# Patient Record
Sex: Male | Born: 1965 | Hispanic: No | Marital: Single | Smoking: Former smoker
Health system: Southern US, Community
[De-identification: ages and names within clinical notes are randomized; demographics above are authoritative.]

## PROBLEM LIST (undated history)

## (undated) DIAGNOSIS — T8859XA Other complications of anesthesia, initial encounter: Secondary | ICD-10-CM

## (undated) DIAGNOSIS — I10 Essential (primary) hypertension: Secondary | ICD-10-CM

## (undated) DIAGNOSIS — F419 Anxiety disorder, unspecified: Secondary | ICD-10-CM

## (undated) DIAGNOSIS — K219 Gastro-esophageal reflux disease without esophagitis: Secondary | ICD-10-CM

## (undated) DIAGNOSIS — Z9889 Other specified postprocedural states: Secondary | ICD-10-CM

## (undated) DIAGNOSIS — T4145XA Adverse effect of unspecified anesthetic, initial encounter: Secondary | ICD-10-CM

## (undated) DIAGNOSIS — G4730 Sleep apnea, unspecified: Secondary | ICD-10-CM

## (undated) DIAGNOSIS — R112 Nausea with vomiting, unspecified: Secondary | ICD-10-CM

## (undated) HISTORY — PX: BRONCHOSCOPIC REMOVAL OF FOREIGN BODY: SHX5171

## (undated) HISTORY — PX: TONSILLECTOMY: SUR1361

---
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
## 1989-02-13 DIAGNOSIS — R112 Nausea with vomiting, unspecified: Secondary | ICD-10-CM

## 1989-02-13 DIAGNOSIS — Z9889 Other specified postprocedural states: Secondary | ICD-10-CM

## 1989-02-13 HISTORY — DX: Other specified postprocedural states: Z98.890

## 1989-02-13 HISTORY — DX: Nausea with vomiting, unspecified: R11.2

## 1994-06-15 HISTORY — PX: EPIDIDYMECTOMY: SHX478

## 2012-01-01 ENCOUNTER — Emergency Department (HOSPITAL_BASED_OUTPATIENT_CLINIC_OR_DEPARTMENT_OTHER): Payer: Worker's Compensation

## 2012-01-01 ENCOUNTER — Emergency Department (HOSPITAL_BASED_OUTPATIENT_CLINIC_OR_DEPARTMENT_OTHER)
Admission: EM | Admit: 2012-01-01 | Discharge: 2012-01-01 | Disposition: A | Discharge status: Home / Self Care | Payer: Worker's Compensation | Attending: Emergency Medicine | Admitting: Emergency Medicine

## 2012-01-01 ENCOUNTER — Encounter (HOSPITAL_BASED_OUTPATIENT_CLINIC_OR_DEPARTMENT_OTHER): Payer: Self-pay | Admitting: Emergency Medicine

## 2012-01-01 DIAGNOSIS — F411 Generalized anxiety disorder: Secondary | ICD-10-CM | POA: Insufficient documentation

## 2012-01-01 DIAGNOSIS — M47817 Spondylosis without myelopathy or radiculopathy, lumbosacral region: Secondary | ICD-10-CM | POA: Insufficient documentation

## 2012-01-01 DIAGNOSIS — K219 Gastro-esophageal reflux disease without esophagitis: Secondary | ICD-10-CM | POA: Insufficient documentation

## 2012-01-01 DIAGNOSIS — T148XXA Other injury of unspecified body region, initial encounter: Secondary | ICD-10-CM

## 2012-01-01 DIAGNOSIS — S76219A Strain of adductor muscle, fascia and tendon of unspecified thigh, initial encounter: Secondary | ICD-10-CM

## 2012-01-01 DIAGNOSIS — W010XXA Fall on same level from slipping, tripping and stumbling without subsequent striking against object, initial encounter: Secondary | ICD-10-CM | POA: Insufficient documentation

## 2012-01-01 DIAGNOSIS — F17200 Nicotine dependence, unspecified, uncomplicated: Secondary | ICD-10-CM | POA: Insufficient documentation

## 2012-01-01 DIAGNOSIS — S72009A Fracture of unspecified part of neck of unspecified femur, initial encounter for closed fracture: Secondary | ICD-10-CM | POA: Insufficient documentation

## 2012-01-01 DIAGNOSIS — I10 Essential (primary) hypertension: Secondary | ICD-10-CM | POA: Insufficient documentation

## 2012-01-01 DIAGNOSIS — IMO0002 Reserved for concepts with insufficient information to code with codable children: Secondary | ICD-10-CM | POA: Insufficient documentation

## 2012-01-01 HISTORY — DX: Essential (primary) hypertension: I10

## 2012-01-01 HISTORY — DX: Anxiety disorder, unspecified: F41.9

## 2012-01-01 HISTORY — DX: Gastro-esophageal reflux disease without esophagitis: K21.9

## 2012-01-01 MED ORDER — HYDROCODONE-ACETAMINOPHEN 5-500 MG PO TABS: 1.0000 | ORAL_TABLET | Freq: Four times a day (QID) | ORAL | Status: AC | PRN

## 2012-01-01 MED ORDER — HYDROCODONE-ACETAMINOPHEN 5-325 MG PO TABS
2.0000 | ORAL_TABLET | Freq: Once | ORAL | Status: AC
  Administered 2012-01-01: 2 via ORAL
  Filled 2012-01-01: qty 2

## 2012-01-01 NOTE — ED Provider Notes (Signed)
History    46 year old male with right hip and right groin pain. Acute onset earlier this evening while at work. Patient states that he slipped on a wet surface. Pivoted ackwardly on foot and then fell to ground. HAs been having persistent pain since. Mild to moderate ache at rest and much more intense pain with movement. Can ambulate, but with increased pain. No numbness, tingling or loss of strength. No abdominall or back pain.   CSN: [id number]  Arrival date & time 01/01/12  0038   First MD Initiated Contact with Patient 01/01/12 0106      Chief Complaint  Patient presents with  . Fall    (Consider location/radiation/quality/duration/timing/severity/associated sxs/prior treatment) HPI  Past Medical History  Diagnosis Date  . Anxiety   . Hypertension   . GERD (gastroesophageal reflux disease)     History reviewed. No pertinent past surgical history.  History reviewed. No pertinent family history.  History  Substance Use Topics  . Smoking status: Current Everyday Smoker    Types: Cigarettes  . Smokeless tobacco: Not on file  . Alcohol Use: No      Review of Systems   Review of symptoms negative unless otherwise noted in HPI.   Allergies  Nsaids  Home Medications   Current Outpatient Rx  Name Route Sig Dispense Refill  . ALPRAZOLAM 1 MG PO TABS Oral Take 1 mg by mouth at bedtime as needed.    Marland Kitchen METOPROLOL TARTRATE 50 MG PO TABS Oral Take 50 mg by mouth 2 (two) times daily.    Marland Kitchen OMEPRAZOLE 20 MG PO CPDR Oral Take 20 mg by mouth daily.    Marland Kitchen HYDROCODONE-ACETAMINOPHEN 5-500 MG PO TABS Oral Take 1-2 tablets by mouth every 6 (six) hours as needed for pain. 15 tablet 0    BP 138/86  Pulse 69  Temp 98.9 F (37.2 C) (Oral)  Resp 16  SpO2 99%  Physical Exam  Nursing note and vitals reviewed. Constitutional: He appears well-developed and well-nourished. No distress.  HENT:  Head: Normocephalic and atraumatic.  Eyes: Conjunctivae are normal. Right eye exhibits  no discharge. Left eye exhibits no discharge.  Neck: Neck supple.  Cardiovascular: Normal rate, regular rhythm and normal heart sounds.  Exam reveals no gallop and no friction rub.   No murmur heard. Pulmonary/Chest: Effort normal and breath sounds normal. No respiratory distress.  Abdominal: Soft. He exhibits no distension. There is no tenderness.  Genitourinary:       Normal external male genitalia. No hernia appreciated.  Musculoskeletal: He exhibits no edema and no tenderness.       Tenderness along the right inguinal crease, particularly the medial aspect. No hernia appreciated. No overlying skin changes. Pain very reproducible with hip flexion and internal rotation. Neurovascularly intact distally.  Neurological: He is alert.  Skin: Skin is warm and dry.  Psychiatric: He has a normal mood and affect. His behavior is normal. Thought content normal.    ED Course  Procedures (including critical care time)  Labs Reviewed - No data to display Dg Hip Complete Right  01/01/2012  *RADIOLOGY REPORT*  Clinical Data: Right hip pain after fall yesterday.  Right groin pain.  RIGHT HIP - COMPLETE 2+ VIEW  Comparison: None.  Findings: There is a focal calcification along the medial aspect of the right femoral neck which appears to be well corticated.  This could represent ligamentous calcification but an avulsion fragment is not excluded.  Mild degenerative changes in the lower lumbar spine and hips.  Pelvis, sacrum, SI joints, and symphysis pubis appear intact.  No additional evidence of acute fracture or subluxation of the right hip.  No focal bone lesion or bone destruction.  IMPRESSION: No displaced fractures identified.  Nonspecific osseous fragment medial to the right femoral neck could represent ligamentous calcification or avulsion fragment.  Original Report Authenticated By: Marlon Pel, M.D.     1. Inguinal strain   2. Femoral avulsion fracture    MDM  46ym with L hip/groin  pain. XR with possible avulsion fx which may be the case given pt's symptoms and exam.. Plan symptomatic tx at this time and ortho referral.        Raeford Razor, MD 01/01/12 [phone]

## 2012-01-01 NOTE — ED Notes (Signed)
Slipped on something wet on floor at work and right leg went out from under him, developed immediate pain in right hip and right groin

## 2012-07-16 ENCOUNTER — Inpatient Hospital Stay (HOSPITAL_COMMUNITY)
Admission: EM | Admit: 2012-07-16 | Discharge: 2012-07-17 | DRG: 175 | Disposition: A | Discharge status: Home / Self Care | Payer: BC Managed Care – PPO | Attending: Emergency Medicine | Admitting: Emergency Medicine

## 2012-07-16 ENCOUNTER — Encounter (HOSPITAL_COMMUNITY): Payer: Self-pay | Admitting: *Deleted

## 2012-07-16 DIAGNOSIS — Z79899 Other long term (current) drug therapy: Secondary | ICD-10-CM

## 2012-07-16 DIAGNOSIS — K219 Gastro-esophageal reflux disease without esophagitis: Secondary | ICD-10-CM | POA: Diagnosis present

## 2012-07-16 DIAGNOSIS — F411 Generalized anxiety disorder: Secondary | ICD-10-CM | POA: Diagnosis present

## 2012-07-16 DIAGNOSIS — F17200 Nicotine dependence, unspecified, uncomplicated: Secondary | ICD-10-CM | POA: Diagnosis present

## 2012-07-16 DIAGNOSIS — I10 Essential (primary) hypertension: Secondary | ICD-10-CM | POA: Diagnosis present

## 2012-07-16 DIAGNOSIS — K625 Hemorrhage of anus and rectum: Secondary | ICD-10-CM | POA: Diagnosis present

## 2012-07-16 DIAGNOSIS — K922 Gastrointestinal hemorrhage, unspecified: Principal | ICD-10-CM | POA: Diagnosis present

## 2012-07-16 LAB — CBC WITH DIFFERENTIAL/PLATELET
Eosinophils Absolute: 0.1 10*3/uL (ref 0.0–0.7)
HCT: 49.3 % (ref 39.0–52.0)
Hemoglobin: 17.5 g/dL — ABNORMAL HIGH (ref 13.0–17.0)
Lymphs Abs: 2.1 10*3/uL (ref 0.7–4.0)
MCH: 30.2 pg (ref 26.0–34.0)
MCHC: 35.5 g/dL (ref 30.0–36.0)
MCV: 85.1 fL (ref 78.0–100.0)
Monocytes Absolute: 0.5 10*3/uL (ref 0.1–1.0)
Monocytes Relative: 6 % (ref 3–12)
Neutrophils Relative %: 67 % (ref 43–77)
RBC: 5.79 MIL/uL (ref 4.22–5.81)

## 2012-07-16 LAB — HEPATIC FUNCTION PANEL
Albumin: 4.5 g/dL (ref 3.5–5.2)
Alkaline Phosphatase: 104 U/L (ref 39–117)
Bilirubin, Direct: <0.1 mg/dL (ref 0.0–0.3)
Total Bilirubin: 0.4 mg/dL (ref 0.3–1.2)

## 2012-07-16 LAB — PROTIME-INR: Prothrombin Time: 12.6 seconds (ref 11.6–15.2)

## 2012-07-16 LAB — POCT I-STAT, CHEM 8
BUN: 17 mg/dL (ref 6–23)
Chloride: 106 mEq/L (ref 96–112)
Glucose, Bld: 156 mg/dL — ABNORMAL HIGH (ref 70–99)
HCT: 53 % — ABNORMAL HIGH (ref 39.0–52.0)
Potassium: 3.6 mEq/L (ref 3.5–5.1)

## 2012-07-16 LAB — LACTIC ACID, PLASMA: Lactic Acid, Venous: 3.1 mmol/L — ABNORMAL HIGH (ref 0.5–2.2)

## 2012-07-16 MED ORDER — HYDROMORPHONE HCL PF 1 MG/ML IJ SOLN
1.0000 mg | Freq: Once | INTRAMUSCULAR | Status: AC
Start: 2012-07-16 — End: 2012-07-16
  Administered 2012-07-16: 1 mg via INTRAVENOUS
  Filled 2012-07-16: qty 1

## 2012-07-16 MED ORDER — SODIUM CHLORIDE 0.9 % IV BOLUS (SEPSIS)
1000.0000 mL | Freq: Once | INTRAVENOUS | Status: AC
  Administered 2012-07-16: 1000 mL via INTRAVENOUS

## 2012-07-16 MED ORDER — HYDROMORPHONE HCL PF 1 MG/ML IJ SOLN
1.0000 mg | Freq: Once | INTRAMUSCULAR | Status: AC
  Administered 2012-07-16: 1 mg via INTRAVENOUS
  Filled 2012-07-16: qty 1

## 2012-07-16 MED ORDER — ONDANSETRON HCL 4 MG/2ML IJ SOLN
4.0000 mg | Freq: Once | INTRAMUSCULAR | Status: AC
  Administered 2012-07-16: 4 mg via INTRAVENOUS
  Filled 2012-07-16: qty 2

## 2012-07-16 NOTE — ED Provider Notes (Signed)
History     CSN: [id number]  Arrival date & time 07/16/12  2223   None     Chief Complaint  Patient presents with  . Abdominal Pain    (Consider location/radiation/quality/duration/timing/severity/associated sxs/prior treatment) HPI Comments: Sudden onset diffuse abdominal pan with bloody BM about 3 hours ago.  Reports yesterday notice some blood in stool X1.  Has Hx of blood in stool has colonoscopy 2008 which revealed polyps.  Has Hx GERD. Denies NSAIS use   Patient is a 47 y.o. male presenting with abdominal pain.  Abdominal Pain The primary symptoms of the illness include abdominal pain and nausea. The primary symptoms of the illness do not include fever, shortness of breath, vomiting or diarrhea. The current episode started 1 to 2 hours ago. The onset of the illness was sudden. The problem has been rapidly worsening.  The abdominal pain began 3 to 5 hours ago. The pain came on suddenly. The abdominal pain has been gradually worsening since its onset. The abdominal pain is generalized. The severity of the abdominal pain is 9/10. The abdominal pain is relieved by nothing.  The patient states that she believes she is currently not pregnant. The patient has had a change in bowel habit. Symptoms associated with the illness do not include chills.    Past Medical History  Diagnosis Date  . Anxiety   . Hypertension   . GERD (gastroesophageal reflux disease)     History reviewed. No pertinent past surgical history.  Family History  Problem Relation Age of Onset  . COPD Mother   . Heart failure Father     History  Substance Use Topics  . Smoking status: Current Every Day Smoker    Types: Cigarettes  . Smokeless tobacco: Not on file  . Alcohol Use: No      Review of Systems  Constitutional: Negative for fever and chills.  HENT: Negative.   Eyes: Negative.   Respiratory: Negative for shortness of breath.   Cardiovascular: Negative for chest pain.  Gastrointestinal:  Positive for nausea, abdominal pain and blood in stool. Negative for vomiting, diarrhea and rectal pain.  Genitourinary: Negative for decreased urine volume.  Skin: Negative for rash and wound.  Neurological: Negative for weakness.    Allergies  Nsaids  Home Medications   Current Outpatient Rx  Name  Route  Sig  Dispense  Refill  . ALPRAZOLAM 1 MG PO TABS   Oral   Take 1 mg by mouth 3 (three) times daily as needed. For anxiety         . METOPROLOL TARTRATE 50 MG PO TABS   Oral   Take 50 mg by mouth daily.          Marland Kitchen OMEPRAZOLE 20 MG PO CPDR   Oral   Take 20 mg by mouth daily.           BP 120/68  Pulse 80  Resp 18  SpO2 95%  Physical Exam  Constitutional: He appears well-developed and well-nourished.  HENT:  Head: Normocephalic.  Eyes: Pupils are equal, round, and reactive to light.  Neck: Normal range of motion.  Cardiovascular: Normal rate.   Pulmonary/Chest: Effort normal.  Genitourinary:       Bright red blood in toilet with BM little stool   Musculoskeletal: Normal range of motion.  Neurological: He is alert.  Skin: Skin is warm and dry. No pallor.    ED Course  Procedures (including critical care time)  Labs Reviewed  CBC WITH  DIFFERENTIAL - Abnormal; Notable for the following:    Hemoglobin 17.5 (*)     All other components within normal limits  LACTIC ACID, PLASMA - Abnormal; Notable for the following:    Lactic Acid, Venous 3.1 (*)     All other components within normal limits  POCT I-STAT, CHEM 8 - Abnormal; Notable for the following:    Creatinine, Ser 1.40 (*)     Glucose, Bld 156 (*)     Calcium, Ion 1.07 (*)     Hemoglobin 18.0 (*)     HCT 53.0 (*)     All other components within normal limits  HEPATIC FUNCTION PANEL  PROTIME-INR  TYPE AND SCREEN  ABO/RH  LACTIC ACID, PLASMA  CBC   Ct Abdomen Pelvis W Contrast  07/17/2012  *RADIOLOGY REPORT*  Clinical Data: Right-sided abdominal pain, nausea and vomiting.  GI bleed.  CT  ABDOMEN AND PELVIS WITH CONTRAST  Technique:  Multidetector CT imaging of the abdomen and pelvis was performed following the standard protocol during bolus administration of intravenous contrast.  Contrast: OMNIPAQUE IOHEXOL 300 MG/ML  SOLN  Comparison: None.  Findings: Mild dependent atelectasis in the lung bases.  Thickening of the wall of the distal esophagus suggesting esophagitis.  Low attenuation change throughout the liver consistent with fatty infiltration.  Sub centimeter low attenuation lesions centrally in the liver likely representing a small cyst or hemangioma.  The gallbladder, spleen, pancreas, adrenal glands, kidneys, abdominal aorta, and retroperitoneal lymph nodes are unremarkable.  Celiac axis lymph nodes are noted but are not pathologically enlarged. The stomach, small bowel, and colon are not abnormally distended. No discrete wall thickening is appreciated.  No free air or free fluid in the abdomen.  Flow is demonstrated in the portal and mesenteric vessels.  Pelvis:  Prostate gland is not enlarged but contains calcifications.  The bladder wall is not thickened.  No evidence of diverticulitis.  The appendix is not identified.  Probable opaque medication in the cecum.  No free or loculated pelvic fluid collections.  No significant pelvic lymphadenopathy.  Degenerative changes in the lumbar spine.  IMPRESSION: Diffuse fatty infiltration of the liver.  Thickening of the wall of the distal esophagus may suggest esophagitis.  No acute process otherwise identified.   Original Report Authenticated By: Burman Nieves, M.D.      1. GI bleed       MDM  Ct Scan negative VS stable labs reviewed  Pain controlled with Morphine to be admitted and observed  Spoke with patient and family  Cbc improved as well as Lactic acid not normal will return if needed and will establish with PCP      Arman Filter, NP 07/17/12 0411  Arman Filter, NP 07/17/12 0411  Arman Filter, NP 07/17/12  [phone]

## 2012-07-16 NOTE — ED Notes (Signed)
Pt in c/o severe RLQ abd pain over last few hours, pain came on suddenly, increased over last hour and radiating into right flank and back, also n/v, pt unable to sit still due to pain

## 2012-07-16 NOTE — ED Provider Notes (Signed)
47 year old who with a history of lower GI bleed in the past presents with acute onset of lower abdominal pain associated with nausea and voluminous amount of lower GI bleeding.  In the past his prior GI bleed was related to a polyp that was cauterized on colonoscopy  On exam the patient has a soft abdomen which is nontender on the left, minimally tender in the right lower quadrant, non-peritoneal. His heart and lung exam is normal, the patient appears uncomfortable and has dry mucous membranes.  We'll obtain labs to evaluate for lower GI bleed, will need rectal exam performed by nurse practitioner, pain medication, reevaluate  Medical screening examination/treatment/procedure(s) were conducted as a shared visit with non-physician practitioner(s) and myself.  I personally evaluated the patient during the encounter    Vida Roller, MD 07/16/12 2320

## 2012-07-17 ENCOUNTER — Emergency Department (HOSPITAL_COMMUNITY): Payer: BC Managed Care – PPO

## 2012-07-17 ENCOUNTER — Encounter (HOSPITAL_COMMUNITY): Payer: Self-pay | Admitting: Internal Medicine

## 2012-07-17 DIAGNOSIS — K625 Hemorrhage of anus and rectum: Secondary | ICD-10-CM | POA: Diagnosis present

## 2012-07-17 LAB — ABO/RH: ABO/RH(D): B POS

## 2012-07-17 LAB — CBC
HCT: 46.6 % (ref 39.0–52.0)
Hemoglobin: 16.2 g/dL (ref 13.0–17.0)
MCHC: 34.8 g/dL (ref 30.0–36.0)
RBC: 5.42 MIL/uL (ref 4.22–5.81)

## 2012-07-17 LAB — TYPE AND SCREEN

## 2012-07-17 LAB — LACTIC ACID, PLASMA: Lactic Acid, Venous: 1.4 mmol/L (ref 0.5–2.2)

## 2012-07-17 MED ORDER — MORPHINE SULFATE 4 MG/ML IJ SOLN
4.0000 mg | Freq: Once | INTRAMUSCULAR | Status: AC
  Administered 2012-07-17: 4 mg via INTRAVENOUS
  Filled 2012-07-17: qty 1

## 2012-07-17 MED ORDER — IOHEXOL 300 MG/ML  SOLN
100.0000 mL | Freq: Once | INTRAMUSCULAR | Status: AC | PRN
  Administered 2012-07-17: 100 mL via INTRAVENOUS

## 2012-07-17 MED ORDER — IOHEXOL 300 MG/ML  SOLN
25.0000 mL | INTRAMUSCULAR | Status: AC
  Administered 2012-07-17 (×2): 25 mL via ORAL

## 2012-07-17 MED ORDER — KETOROLAC TROMETHAMINE 30 MG/ML IJ SOLN
30.0000 mg | Freq: Once | INTRAMUSCULAR | Status: AC
  Administered 2012-07-17: 30 mg via INTRAVENOUS
  Filled 2012-07-17: qty 1

## 2012-07-17 NOTE — ED Notes (Signed)
Patient transported to CT 

## 2012-07-17 NOTE — ED Notes (Signed)
Pt finished contrast for CT scan

## 2012-07-17 NOTE — ED Notes (Signed)
Patient returned from CT

## 2012-07-17 NOTE — ED Notes (Signed)
Pt refuses to be admitted,  So we will do a repeat blood draw per Dondra Spry NP

## 2012-07-18 ENCOUNTER — Encounter (HOSPITAL_BASED_OUTPATIENT_CLINIC_OR_DEPARTMENT_OTHER): Payer: Self-pay | Admitting: *Deleted

## 2012-07-18 ENCOUNTER — Emergency Department (HOSPITAL_BASED_OUTPATIENT_CLINIC_OR_DEPARTMENT_OTHER)
Admission: EM | Admit: 2012-07-18 | Discharge: 2012-07-19 | Disposition: A | Discharge status: Home / Self Care | Payer: BC Managed Care – PPO | Attending: Emergency Medicine | Admitting: Emergency Medicine

## 2012-07-18 DIAGNOSIS — I10 Essential (primary) hypertension: Secondary | ICD-10-CM | POA: Insufficient documentation

## 2012-07-18 DIAGNOSIS — K219 Gastro-esophageal reflux disease without esophagitis: Secondary | ICD-10-CM | POA: Insufficient documentation

## 2012-07-18 DIAGNOSIS — N200 Calculus of kidney: Secondary | ICD-10-CM | POA: Insufficient documentation

## 2012-07-18 DIAGNOSIS — Z79899 Other long term (current) drug therapy: Secondary | ICD-10-CM | POA: Insufficient documentation

## 2012-07-18 DIAGNOSIS — F17200 Nicotine dependence, unspecified, uncomplicated: Secondary | ICD-10-CM | POA: Insufficient documentation

## 2012-07-18 DIAGNOSIS — F411 Generalized anxiety disorder: Secondary | ICD-10-CM | POA: Insufficient documentation

## 2012-07-18 LAB — COMPREHENSIVE METABOLIC PANEL
ALT: 23 U/L (ref 0–53)
AST: 14 U/L (ref 0–37)
Alkaline Phosphatase: 84 U/L (ref 39–117)
CO2: 23 mEq/L (ref 19–32)
Calcium: 8.8 mg/dL (ref 8.4–10.5)
GFR calc Af Amer: 81 mL/min — ABNORMAL LOW (ref >90)
GFR calc non Af Amer: 70 mL/min — ABNORMAL LOW (ref >90)
Glucose, Bld: 116 mg/dL — ABNORMAL HIGH (ref 70–99)
Potassium: 3.5 mEq/L (ref 3.5–5.1)
Sodium: 139 mEq/L (ref 135–145)
Total Protein: 6.4 g/dL (ref 6.0–8.3)

## 2012-07-18 LAB — URINE MICROSCOPIC-ADD ON

## 2012-07-18 LAB — CBC WITH DIFFERENTIAL/PLATELET
Basophils Absolute: 0 10*3/uL (ref 0.0–0.1)
Eosinophils Relative: 2 % (ref 0–5)
Lymphocytes Relative: 17 % (ref 12–46)
Lymphs Abs: 1.7 10*3/uL (ref 0.7–4.0)
Neutrophils Relative %: 74 % (ref 43–77)
Platelets: 151 10*3/uL (ref 150–400)
RBC: 5.31 MIL/uL (ref 4.22–5.81)
RDW: 13.3 % (ref 11.5–15.5)
WBC: 10.3 10*3/uL (ref 4.0–10.5)

## 2012-07-18 LAB — URINALYSIS, ROUTINE W REFLEX MICROSCOPIC
Glucose, UA: NEGATIVE mg/dL
Leukocytes, UA: NEGATIVE
Protein, ur: NEGATIVE mg/dL
Specific Gravity, Urine: 1.023 (ref 1.005–1.030)
pH: 5.5 (ref 5.0–8.0)

## 2012-07-18 MED ORDER — HYDROMORPHONE HCL PF 1 MG/ML IJ SOLN
1.0000 mg | Freq: Once | INTRAMUSCULAR | Status: AC
  Administered 2012-07-18: 1 mg via INTRAVENOUS
  Filled 2012-07-18: qty 1

## 2012-07-18 MED ORDER — TAMSULOSIN HCL 0.4 MG PO CAPS: 0.4000 mg | ORAL_CAPSULE | Freq: Every day | ORAL | Status: DC

## 2012-07-18 MED ORDER — ONDANSETRON HCL 4 MG/2ML IJ SOLN
4.0000 mg | Freq: Once | INTRAMUSCULAR | Status: AC
  Administered 2012-07-18: 4 mg via INTRAVENOUS
  Filled 2012-07-18: qty 2

## 2012-07-18 MED ORDER — SODIUM CHLORIDE 0.9 % IV SOLN
Freq: Once | INTRAVENOUS | Status: AC
  Administered 2012-07-18: 23:00:00 via INTRAVENOUS

## 2012-07-18 MED ORDER — OXYCODONE-ACETAMINOPHEN 5-325 MG PO TABS: 2.0000 | ORAL_TABLET | ORAL | Status: DC | PRN

## 2012-07-18 MED ORDER — KETOROLAC TROMETHAMINE 30 MG/ML IJ SOLN
30.0000 mg | Freq: Once | INTRAMUSCULAR | Status: AC
  Administered 2012-07-18: 30 mg via INTRAVENOUS
  Filled 2012-07-18: qty 1

## 2012-07-18 NOTE — ED Provider Notes (Signed)
History     CSN: [id number]  Arrival date & time 07/18/12  2101   First MD Initiated Contact with Patient 07/18/12 2146      Chief Complaint  Patient presents with  . Flank Pain    (Consider location/radiation/quality/duration/timing/severity/associated sxs/prior treatment) Patient is a 47 y.o. male presenting with flank pain. The history is provided by the patient. No language interpreter was used.  Flank Pain This is a new problem. The current episode started in the past 7 days. The problem occurs constantly. The problem has been gradually worsening. Associated symptoms include abdominal pain. Nothing aggravates the symptoms. He has tried nothing for the symptoms.  Pt reports he was seen  Past Medical History  Diagnosis Date  . Anxiety   . Hypertension   . GERD (gastroesophageal reflux disease)     History reviewed. No pertinent past surgical history.  Family History  Problem Relation Age of Onset  . COPD Mother   . Heart failure Father     History  Substance Use Topics  . Smoking status: Current Every Day Smoker    Types: Cigarettes  . Smokeless tobacco: Not on file  . Alcohol Use: No      Review of Systems  Gastrointestinal: Positive for abdominal pain.  Genitourinary: Positive for flank pain.  All other systems reviewed and are negative.    Allergies  Nsaids  Home Medications   Current Outpatient Rx  Name  Route  Sig  Dispense  Refill  . ALPRAZOLAM 1 MG PO TABS   Oral   Take 1 mg by mouth 3 (three) times daily as needed. For anxiety         . METOPROLOL TARTRATE 50 MG PO TABS   Oral   Take 50 mg by mouth daily.          Marland Kitchen OMEPRAZOLE 20 MG PO CPDR   Oral   Take 20 mg by mouth daily.           BP 134/85  Pulse 76  Temp 99.1 F (37.3 C) (Oral)  Resp 14  SpO2 98%  Physical Exam  Nursing note and vitals reviewed. Constitutional: He is oriented to person, place, and time.  HENT:  Head: Normocephalic.  Right Ear: External ear  normal.  Left Ear: External ear normal.  Nose: Nose normal.  Mouth/Throat: Oropharynx is clear and moist.  Eyes: Conjunctivae normal and EOM are normal. Pupils are equal, round, and reactive to light.  Neck: Normal range of motion.  Cardiovascular: Normal rate and normal heart sounds.   Pulmonary/Chest: Effort normal and breath sounds normal.  Abdominal: Soft. Bowel sounds are normal.  Musculoskeletal: Normal range of motion.  Neurological: He is alert and oriented to person, place, and time. He has normal reflexes.  Skin: Skin is warm.  Psychiatric: He has a normal mood and affect.    ED Course  Procedures (including critical care time)  Labs Reviewed  URINALYSIS, ROUTINE W REFLEX MICROSCOPIC - Abnormal; Notable for the following:    Color, Urine AMBER (*)  BIOCHEMICALS MAY BE AFFECTED BY COLOR   Hgb urine dipstick LARGE (*)     Bilirubin Urine SMALL (*)     Ketones, ur 15 (*)     All other components within normal limits  URINE MICROSCOPIC-ADD ON  CBC WITH DIFFERENTIAL  COMPREHENSIVE METABOLIC PANEL   Ct Abdomen Pelvis W Contrast  07/17/2012  *RADIOLOGY REPORT*  Clinical Data: Right-sided abdominal pain, nausea and vomiting.  GI bleed.  CT  ABDOMEN AND PELVIS WITH CONTRAST  Technique:  Multidetector CT imaging of the abdomen and pelvis was performed following the standard protocol during bolus administration of intravenous contrast.  Contrast: OMNIPAQUE IOHEXOL 300 MG/ML  SOLN  Comparison: None.  Findings: Mild dependent atelectasis in the lung bases.  Thickening of the wall of the distal esophagus suggesting esophagitis.  Low attenuation change throughout the liver consistent with fatty infiltration.  Sub centimeter low attenuation lesions centrally in the liver likely representing a small cyst or hemangioma.  The gallbladder, spleen, pancreas, adrenal glands, kidneys, abdominal aorta, and retroperitoneal lymph nodes are unremarkable.  Celiac axis lymph nodes are noted but are  not pathologically enlarged. The stomach, small bowel, and colon are not abnormally distended. No discrete wall thickening is appreciated.  No free air or free fluid in the abdomen.  Flow is demonstrated in the portal and mesenteric vessels.  Pelvis:  Prostate gland is not enlarged but contains calcifications.  The bladder wall is not thickened.  No evidence of diverticulitis.  The appendix is not identified.  Probable opaque medication in the cecum.  No free or loculated pelvic fluid collections.  No significant pelvic lymphadenopathy.  Degenerative changes in the lumbar spine.  IMPRESSION: Diffuse fatty infiltration of the liver.  Thickening of the wall of the distal esophagus may suggest esophagitis.  No acute process otherwise identified.   Original Report Authenticated By: Burman Nieves, M.D.      1. Kidney stone       MDM    Ct scan reviewed by Dr. Ranae Palms,   Pt has an area inright lower ureter that looks like a small stone.   I spoke to the radiologist who reviewed ct from 2 days ago and sees a 2mm stone at right uvj.   Pt advised of results.   Pt reports some relief with dilaudid.  Pt given Toradol and dilaudid IV.     Pt advised to follow up with urology.       Lonia Skinner [place], [place] 07/18/12 [phone]

## 2012-07-18 NOTE — ED Notes (Addendum)
Right flank pain x 2 days. Was seen at [facility] and had a negative work up. Drove himself here.

## 2012-07-18 NOTE — ED Provider Notes (Signed)
  Medical screening examination/treatment/procedure(s) were conducted as a shared visit with non-physician practitioner(s) and myself.  I personally evaluated the patient during the encounter  Please see my separate respective documentation pertaining to this patient encounter   Vida Roller, MD 07/18/12 [phone]

## 2012-07-18 NOTE — ED Notes (Signed)
Seen at [facility] on 07/16/12 for same.  CT negative.

## 2012-07-19 NOTE — ED Provider Notes (Signed)
Medical screening examination/treatment/procedure(s) were performed by non-physician practitioner and as supervising physician I was immediately available for consultation/collaboration.   Loren Racer, MD 07/19/12 2222

## 2012-07-20 ENCOUNTER — Encounter (HOSPITAL_BASED_OUTPATIENT_CLINIC_OR_DEPARTMENT_OTHER): Payer: Self-pay | Admitting: Emergency Medicine

## 2012-07-20 ENCOUNTER — Emergency Department (HOSPITAL_BASED_OUTPATIENT_CLINIC_OR_DEPARTMENT_OTHER)
Admission: EM | Admit: 2012-07-20 | Discharge: 2012-07-20 | Disposition: A | Discharge status: Home / Self Care | Payer: BC Managed Care – PPO | Attending: Emergency Medicine | Admitting: Emergency Medicine

## 2012-07-20 DIAGNOSIS — R110 Nausea: Secondary | ICD-10-CM | POA: Insufficient documentation

## 2012-07-20 DIAGNOSIS — R509 Fever, unspecified: Secondary | ICD-10-CM | POA: Insufficient documentation

## 2012-07-20 DIAGNOSIS — F411 Generalized anxiety disorder: Secondary | ICD-10-CM | POA: Insufficient documentation

## 2012-07-20 DIAGNOSIS — N390 Urinary tract infection, site not specified: Secondary | ICD-10-CM

## 2012-07-20 DIAGNOSIS — K921 Melena: Secondary | ICD-10-CM | POA: Insufficient documentation

## 2012-07-20 DIAGNOSIS — K219 Gastro-esophageal reflux disease without esophagitis: Secondary | ICD-10-CM | POA: Insufficient documentation

## 2012-07-20 DIAGNOSIS — F17200 Nicotine dependence, unspecified, uncomplicated: Secondary | ICD-10-CM | POA: Insufficient documentation

## 2012-07-20 DIAGNOSIS — N23 Unspecified renal colic: Secondary | ICD-10-CM | POA: Insufficient documentation

## 2012-07-20 DIAGNOSIS — I10 Essential (primary) hypertension: Secondary | ICD-10-CM | POA: Insufficient documentation

## 2012-07-20 DIAGNOSIS — Z79899 Other long term (current) drug therapy: Secondary | ICD-10-CM | POA: Insufficient documentation

## 2012-07-20 LAB — URINE MICROSCOPIC-ADD ON

## 2012-07-20 LAB — URINALYSIS, ROUTINE W REFLEX MICROSCOPIC
Bilirubin Urine: NEGATIVE
Ketones, ur: NEGATIVE mg/dL
Specific Gravity, Urine: 1.018 (ref 1.005–1.030)
pH: 6 (ref 5.0–8.0)

## 2012-07-20 MED ORDER — HYDROMORPHONE HCL PF 1 MG/ML IJ SOLN
1.0000 mg | Freq: Once | INTRAMUSCULAR | Status: AC
  Administered 2012-07-20: 1 mg via INTRAVENOUS
  Filled 2012-07-20: qty 1

## 2012-07-20 MED ORDER — ONDANSETRON HCL 4 MG/2ML IJ SOLN
4.0000 mg | Freq: Once | INTRAMUSCULAR | Status: AC
  Administered 2012-07-20: 4 mg via INTRAVENOUS
  Filled 2012-07-20: qty 2

## 2012-07-20 MED ORDER — SODIUM CHLORIDE 0.9 % IV SOLN
INTRAVENOUS | Status: DC
  Administered 2012-07-20: 07:00:00 via INTRAVENOUS

## 2012-07-20 MED ORDER — HYDROMORPHONE HCL 4 MG PO TABS: 2.0000 mg | ORAL_TABLET | Freq: Four times a day (QID) | ORAL | Status: DC | PRN

## 2012-07-20 MED ORDER — CIPROFLOXACIN HCL 500 MG PO TABS: 500.0000 mg | ORAL_TABLET | Freq: Two times a day (BID) | ORAL | Status: DC

## 2012-07-20 MED ORDER — DEXTROSE 5 % IV SOLN
1.0000 g | Freq: Once | INTRAVENOUS | Status: AC
  Administered 2012-07-20: 1 g via INTRAVENOUS
  Filled 2012-07-20: qty 10

## 2012-07-20 NOTE — ED Notes (Signed)
Pt is calling his wife to come pick him up. Pt informed that he cannot drive due to narcotic administration. Pt verbalizes understanding and is attempting to call his wife, Boneta Lucks.

## 2012-07-20 NOTE — ED Provider Notes (Signed)
History     CSN: [id number]  Arrival date & time 07/20/12  0640   First MD Initiated Contact with Patient 07/20/12 [phone]      Chief Complaint  Patient presents with  . Flank Pain    (Consider location/radiation/quality/duration/timing/severity/associated sxs/prior treatment) HPI Pt presents to the ED complaining of severe aching R flank and RLQ abdominal pain. He was seen at the [facility] ED about 5 days ago for same when he was also having bloody stools. He had labs and CT done then which were unremarkable. He was feeling better and discharged. He returned here 2 days later with continued pain and re-evaluation of the prior CT revealed a R UVJ stone. He was given pain meds, flomax and was doing better. His pain is worse at night, has been well controlled with Percocet at home until early this morning when pain returned and has been severe and persistent, associated with nausea and low grade fever at home. No dysuria. He has also been constipated.  Past Medical History  Diagnosis Date  . Anxiety   . Hypertension   . GERD (gastroesophageal reflux disease)     History reviewed. No pertinent past surgical history.  Family History  Problem Relation Age of Onset  . COPD Mother   . Heart failure Father     History  Substance Use Topics  . Smoking status: Current Every Day Smoker    Types: Cigarettes  . Smokeless tobacco: Not on file  . Alcohol Use: No      Review of Systems All other systems reviewed and are negative except as noted in HPI.   Allergies  Nsaids  Home Medications   Current Outpatient Rx  Name  Route  Sig  Dispense  Refill  . ALPRAZOLAM 1 MG PO TABS   Oral   Take 1 mg by mouth 3 (three) times daily as needed. For anxiety         . METOPROLOL TARTRATE 50 MG PO TABS   Oral   Take 50 mg by mouth daily.          Marland Kitchen OMEPRAZOLE 20 MG PO CPDR   Oral   Take 20 mg by mouth daily.         . OXYCODONE-ACETAMINOPHEN 5-325 MG PO TABS   Oral   Take 2 tablets  by mouth every 4 (four) hours as needed for pain.   20 tablet   0   . TAMSULOSIN HCL 0.4 MG PO CAPS   Oral   Take 1 capsule (0.4 mg total) by mouth daily.   15 capsule   0     BP 133/84  Pulse 80  Temp 98.5 F (36.9 C) (Oral)  Resp 22  Ht 5\' 9"  (1.753 m)  Wt 210 lb (95.255 kg)  BMI 31.01 kg/m2  SpO2 99%  Physical Exam  Nursing note and vitals reviewed. Constitutional: He is oriented to person, place, and time. He appears well-developed and well-nourished.       Uncomfortable appearing  HENT:  Head: Normocephalic and atraumatic.  Eyes: EOM are normal. Pupils are equal, round, and reactive to light.  Neck: Normal range of motion. Neck supple.  Cardiovascular: Normal rate, normal heart sounds and intact distal pulses.   Pulmonary/Chest: Effort normal and breath sounds normal.  Abdominal: Bowel sounds are normal. He exhibits no distension. There is no tenderness. There is no rebound and no guarding.  Musculoskeletal: Normal range of motion. He exhibits no edema and no tenderness.  Neurological:  He is alert and oriented to person, place, and time. He has normal strength. No cranial nerve deficit or sensory deficit.  Skin: Skin is warm and dry. No rash noted.  Psychiatric: He has a normal mood and affect.    ED Course  Procedures (including critical care time)  Labs Reviewed  URINALYSIS, ROUTINE W REFLEX MICROSCOPIC - Abnormal; Notable for the following:    Hgb urine dipstick LARGE (*)     Leukocytes, UA MODERATE (*)     All other components within normal limits  URINE MICROSCOPIC-ADD ON - Abnormal; Notable for the following:    Bacteria, UA FEW (*)     All other components within normal limits  URINE CULTURE   No results found.   No diagnosis found.    MDM  Previously diagnosed distal ureteral stone which he has not yet passed. Will recheck UA for signs of infection. Pain control in the ED.   9:46 AM Pain improved. UA shows possible early infection. Will  start Abx. Advised to follow up with Urology clinic in 2-3 days for recheck.   Charles B. Bernette Mayers, MD 07/20/12 [phone]

## 2012-07-20 NOTE — ED Notes (Signed)
Pt c/o abdominal pain & flank pain with nausea. Seen here 07/18/12 dx "kidney stone". Pt reports took percocet approx 0200 & 0530 this am w/ no relief.

## 2012-07-21 ENCOUNTER — Encounter (HOSPITAL_BASED_OUTPATIENT_CLINIC_OR_DEPARTMENT_OTHER): Payer: Self-pay | Admitting: *Deleted

## 2012-07-21 ENCOUNTER — Other Ambulatory Visit: Payer: Self-pay | Admitting: Urology

## 2012-07-21 LAB — URINE CULTURE: Culture: NO GROWTH

## 2012-07-21 NOTE — H&P (Signed)
History of Present Illness  This is a new patient seen in consultation for ureteral stone referred by Dr. Bernette Mayers February 2014. About 5 days ago the patient developed the acute onset of right flank pain. This was associated with nausea and vomiting. A CT scan of the abdomen and pelvis was obtained and I reviewed all the images. This showed a 3 mm right distal stone with proximal mild hydroureteronephrosis. There were no other stones. He's been back to the ER 3 times with nausea vomiting and pain. He was discharged recently on tamsulosin and dilaudid. He has been staying hydrated today but still has pain and nausea. He is not been able to work. He is a Control and instrumentation engineer.   Past Medical History Problems  1. History of  Adult Sleep Apnea 780.57 2. History of  Anxiety (Symptom) 300.00 3. History of  Depression 311 4. History of  Esophageal Reflux 530.81 5. History of  Hypertension 401.9 6. History of  Murmurs 785.2  Surgical History Problems  1. History of  Lymphadenectomy 2. History of  Surgery Epididymectomy Left 3. History of  Tonsillectomy  Current Meds 1. Ciprofloxacin HCl 500 MG Oral Tablet; Therapy: (Recorded:06Feb2014) to 2. Dilaudid 4 MG Oral Tablet; Therapy: (Recorded:06Feb2014) to 3. Flomax 0.4 MG Oral Capsule; Therapy: (Recorded:06Feb2014) to 4. Lopressor TABS; Therapy: (Recorded:06Feb2014) to 5. Percocet 5-325 MG Oral Tablet; Therapy: (Recorded:06Feb2014) to 6. PriLOSEC 20 MG Oral Capsule Delayed Release; Therapy: (Recorded:06Feb2014) to 7. Xanax 1 MG Oral Tablet; Therapy: (Recorded:06Feb2014) to  Allergies Medication  1. NSAIDs  Family History Problems  1. Family history of  Family Health Status Number Of Children 1 son 2. Maternal history of  Intentional Injury By Another Person By Murder 3. Maternal history of  Pernicious Anemia  Social History Problems  1. Alcohol Use 2 a month 2. Caffeine Use 3 a day 3. Marital History - Divorced V61.03 4. Occupation: Academic librarian 5. Tobacco Use 305.1 1/2 ppd x 9 years  Review of Systems Constitutional, skin, eye, otolaryngeal, hematologic/lymphatic, cardiovascular, pulmonary, endocrine, musculoskeletal, gastrointestinal, neurological and psychiatric system(s) were reviewed and pertinent findings if present are noted.  Gastrointestinal: nausea, vomiting, heartburn and constipation.  Constitutional: fever and feeling tired (fatigue).  Musculoskeletal: back pain.  Neurological: dizziness.    Vitals Vital Signs [Data Includes: Last 1 Day]  06Feb2014 09:07AM  BMI Calculated: 31.11 BSA Calculated: 2.11 Height: 5 ft 9 in Weight: 210 lb  Blood Pressure: 142 / 90 Temperature: 97.7 F Heart Rate: 65  Physical Exam Constitutional: Well nourished and well developed . No acute distress.  ENT:. The ears and nose are normal in appearance.  Neck: The appearance of the neck is normal and no neck mass is present.  Pulmonary: No respiratory distress and normal respiratory rhythm and effort.  Cardiovascular: Heart rate and rhythm are normal . No peripheral edema.  Abdomen: The abdomen is soft and nontender. No masses are palpated. No CVA tenderness. No hernias are palpable. No hepatosplenomegaly noted.  Lymphatics: The femoral and inguinal nodes are not enlarged or tender.  Skin: Normal skin turgor, no visible rash and no visible skin lesions.  Neuro/Psych:. Mood and affect are appropriate.    Results/Data Urine [Data Includes: Last 1 Day]   06Feb2014  COLOR YELLOW   APPEARANCE CLEAR   SPECIFIC GRAVITY 1.025   pH 6.0   GLUCOSE NEG mg/dL  BILIRUBIN NEG   KETONE NEG mg/dL  BLOOD LARGE   PROTEIN NEG mg/dL  UROBILINOGEN 0.2 mg/dL  NITRITE NEG   LEUKOCYTE  ESTERASE NEG   SQUAMOUS EPITHELIAL/HPF NONE SEEN   WBC 0-2 WBC/hpf  RBC 3-6 RBC/hpf  BACTERIA NONE SEEN   CRYSTALS NONE SEEN   CASTS NONE SEEN    Old records or history reviewed:Marland Kitchen    Assessment Assessed  1. Ureteral Stone 592.1  Plan Health  Maintenance (V70.0)  1. UA With REFLEX  Done: 06Feb2014 08:32AM Ureteral Stone (592.1)  2. Ketorolac Tromethamine 30 MG/ML Injection Solution; INJECT 30 MG Intramuscular; Done:  06Feb2014 09:51AM; Status: COMPLETE 3. Promethazine HCl 12.5 MG Oral Tablet; TAKE 1 TABLET EVERY 4 TO 6 HOURS AS NEEDED  FOR NAUSEA; Therapy: 06Feb2014 to (Evaluate:20Feb2014)  Requested for: 06Feb2014; Last  Rx:06Feb2014; Edited 4. Follow-up Schedule Surgery Office  Follow-up  Requested for: 06Feb2014  Discussion/Summary     I went over the CT results with the patient. We discussed the nature risk and benefits of continued off label alpha blocker use, endoscopic management with ureteroscopy/stent/basket/laser lithotripsy possible pre-stenting , shockwave lithotripsy. All questions answered. He would like to go ahead and schedule something and given the stone size, location and his recent Toradol use we will approach this endoscopically and he elects to proceed. We discussed risk of ureteral injury and stent pain among others. I did give him some samples a Rapaflo and sent in a prescription for promethazine.  cc; Dr. Herschell Dimes Electronically signed by : Jerilee Field, M.D.; Jul 21 2012 10:11AM

## 2012-07-21 NOTE — Progress Notes (Signed)
Walk in from office -To [facility] at 1215- [place], Ekg on arrival-Npo after Mn-will take metoprolol,omeprazole;pain medication as needed in am with small amt water.

## 2012-07-22 ENCOUNTER — Encounter (HOSPITAL_BASED_OUTPATIENT_CLINIC_OR_DEPARTMENT_OTHER): Payer: Self-pay | Admitting: *Deleted

## 2012-07-22 ENCOUNTER — Ambulatory Visit (HOSPITAL_BASED_OUTPATIENT_CLINIC_OR_DEPARTMENT_OTHER): Payer: BC Managed Care – PPO | Admitting: Anesthesiology

## 2012-07-22 ENCOUNTER — Encounter (HOSPITAL_BASED_OUTPATIENT_CLINIC_OR_DEPARTMENT_OTHER)
Admission: RE | Disposition: A | Discharge status: Home / Self Care | Payer: Self-pay | Source: Ambulatory Visit | Attending: Urology

## 2012-07-22 ENCOUNTER — Ambulatory Visit (HOSPITAL_BASED_OUTPATIENT_CLINIC_OR_DEPARTMENT_OTHER)
Admission: RE | Admit: 2012-07-22 | Discharge: 2012-07-22 | Disposition: A | Discharge status: Home / Self Care | Payer: BC Managed Care – PPO | Source: Ambulatory Visit | Attending: Urology | Admitting: Urology

## 2012-07-22 ENCOUNTER — Other Ambulatory Visit: Payer: Self-pay

## 2012-07-22 ENCOUNTER — Encounter (HOSPITAL_BASED_OUTPATIENT_CLINIC_OR_DEPARTMENT_OTHER): Payer: Self-pay | Admitting: Anesthesiology

## 2012-07-22 DIAGNOSIS — Z79899 Other long term (current) drug therapy: Secondary | ICD-10-CM | POA: Insufficient documentation

## 2012-07-22 DIAGNOSIS — K219 Gastro-esophageal reflux disease without esophagitis: Secondary | ICD-10-CM | POA: Insufficient documentation

## 2012-07-22 DIAGNOSIS — G4730 Sleep apnea, unspecified: Secondary | ICD-10-CM | POA: Insufficient documentation

## 2012-07-22 DIAGNOSIS — N201 Calculus of ureter: Secondary | ICD-10-CM | POA: Insufficient documentation

## 2012-07-22 DIAGNOSIS — Z01810 Encounter for preprocedural cardiovascular examination: Secondary | ICD-10-CM | POA: Insufficient documentation

## 2012-07-22 DIAGNOSIS — R011 Cardiac murmur, unspecified: Secondary | ICD-10-CM | POA: Insufficient documentation

## 2012-07-22 DIAGNOSIS — I10 Essential (primary) hypertension: Secondary | ICD-10-CM | POA: Insufficient documentation

## 2012-07-22 DIAGNOSIS — N1330 Unspecified hydronephrosis: Secondary | ICD-10-CM | POA: Insufficient documentation

## 2012-07-22 HISTORY — DX: Adverse effect of unspecified anesthetic, initial encounter: T41.45XA

## 2012-07-22 HISTORY — DX: Other specified postprocedural states: Z98.890

## 2012-07-22 HISTORY — DX: Nausea with vomiting, unspecified: R11.2

## 2012-07-22 HISTORY — DX: Other complications of anesthesia, initial encounter: T88.59XA

## 2012-07-22 HISTORY — DX: Sleep apnea, unspecified: G47.30

## 2012-07-22 LAB — POCT I-STAT 4, (NA,K, GLUC, HGB,HCT)
Glucose, Bld: 111 mg/dL — ABNORMAL HIGH (ref 70–99)
HCT: 42 % (ref 39.0–52.0)
Potassium: 3.7 mEq/L (ref 3.5–5.1)
Sodium: 142 mEq/L (ref 135–145)

## 2012-07-22 SURGERY — CYSTOURETEROSCOPY, WITH STENT INSERTION
Anesthesia: General | Site: Ureter | Laterality: Right | Wound class: Clean Contaminated

## 2012-07-22 MED ORDER — GLYCOPYRROLATE 0.2 MG/ML IJ SOLN
INTRAMUSCULAR | Status: DC | PRN
  Administered 2012-07-22: 0.4 mg via INTRAVENOUS

## 2012-07-22 MED ORDER — LIDOCAINE HCL 4 % MT SOLN
OROMUCOSAL | Status: DC | PRN
  Administered 2012-07-22: 4 mL via TOPICAL

## 2012-07-22 MED ORDER — CEFAZOLIN SODIUM-DEXTROSE 2-3 GM-% IV SOLR
2.0000 g | INTRAVENOUS | Status: AC
  Administered 2012-07-22: 2 g via INTRAVENOUS
  Filled 2012-07-22: qty 50

## 2012-07-22 MED ORDER — SODIUM CHLORIDE 0.9 % IR SOLN
Status: DC | PRN
  Administered 2012-07-22: 6000 mL

## 2012-07-22 MED ORDER — METOCLOPRAMIDE HCL 5 MG/ML IJ SOLN
INTRAMUSCULAR | Status: DC | PRN
  Administered 2012-07-22: 10 mg via INTRAVENOUS

## 2012-07-22 MED ORDER — SUCCINYLCHOLINE CHLORIDE 20 MG/ML IJ SOLN
INTRAMUSCULAR | Status: DC | PRN
  Administered 2012-07-22: 140 mg via INTRAVENOUS

## 2012-07-22 MED ORDER — MIDAZOLAM HCL 5 MG/5ML IJ SOLN
INTRAMUSCULAR | Status: DC | PRN
  Administered 2012-07-22: 2 mg via INTRAVENOUS

## 2012-07-22 MED ORDER — FENTANYL CITRATE 0.05 MG/ML IJ SOLN
25.0000 ug | INTRAMUSCULAR | Status: DC | PRN
  Filled 2012-07-22: qty 1

## 2012-07-22 MED ORDER — ACETAMINOPHEN 10 MG/ML IV SOLN
INTRAVENOUS | Status: DC | PRN
  Administered 2012-07-22: 1000 mg via INTRAVENOUS

## 2012-07-22 MED ORDER — SCOPOLAMINE 1 MG/3DAYS TD PT72
MEDICATED_PATCH | TRANSDERMAL | Status: DC | PRN
  Administered 2012-07-22: 1 mg via TRANSDERMAL

## 2012-07-22 MED ORDER — LIDOCAINE HCL (CARDIAC) 20 MG/ML IV SOLN
INTRAVENOUS | Status: DC | PRN
  Administered 2012-07-22: 80 mg via INTRAVENOUS

## 2012-07-22 MED ORDER — LACTATED RINGERS IV SOLN
INTRAVENOUS | Status: DC
  Administered 2012-07-22 (×2): via INTRAVENOUS
  Filled 2012-07-22: qty 1000

## 2012-07-22 MED ORDER — PROPOFOL 10 MG/ML IV BOLUS
INTRAVENOUS | Status: DC | PRN
  Administered 2012-07-22: 300 mg via INTRAVENOUS

## 2012-07-22 MED ORDER — OXYCODONE-ACETAMINOPHEN 5-325 MG PO TABS: 1.0000 | ORAL_TABLET | ORAL | Status: DC | PRN

## 2012-07-22 MED ORDER — PROMETHAZINE HCL 25 MG/ML IJ SOLN
6.2500 mg | INTRAMUSCULAR | Status: DC | PRN
  Filled 2012-07-22: qty 1

## 2012-07-22 MED ORDER — CEFAZOLIN SODIUM 1-5 GM-% IV SOLN
1.0000 g | INTRAVENOUS | Status: DC
  Filled 2012-07-22: qty 50

## 2012-07-22 MED ORDER — MEPERIDINE HCL 25 MG/ML IJ SOLN
6.2500 mg | INTRAMUSCULAR | Status: DC | PRN
  Filled 2012-07-22: qty 1

## 2012-07-22 MED ORDER — DEXAMETHASONE SODIUM PHOSPHATE 4 MG/ML IJ SOLN
INTRAMUSCULAR | Status: DC | PRN
  Administered 2012-07-22: 10 mg via INTRAVENOUS

## 2012-07-22 MED ORDER — FENTANYL CITRATE 0.05 MG/ML IJ SOLN
INTRAMUSCULAR | Status: DC | PRN
  Administered 2012-07-22 (×2): 50 ug via INTRAVENOUS

## 2012-07-22 MED ORDER — LACTATED RINGERS IV SOLN
INTRAVENOUS | Status: DC
  Filled 2012-07-22: qty 1000

## 2012-07-22 SURGICAL SUPPLY — 38 items
ADAPTER CATH URET PLST 4-6FR (CATHETERS) IMPLANT
BAG DRAIN URO-CYSTO SKYTR STRL (DRAIN) ×3 IMPLANT
BASKET LASER NITINOL 1.9FR (BASKET) IMPLANT
BASKET STNLS GEMINI 4WIRE 3FR (BASKET) IMPLANT
BASKET ZERO TIP NITINOL 2.4FR (BASKET) IMPLANT
BRUSH URET BIOPSY 3F (UROLOGICAL SUPPLIES) IMPLANT
CANISTER SUCT LVC 12 LTR MEDI- (MISCELLANEOUS) ×3 IMPLANT
CATH INTERMIT  6FR 70CM (CATHETERS) ×3 IMPLANT
CATH URET 5FR 28IN CONE TIP (BALLOONS)
CATH URET 5FR 28IN OPEN ENDED (CATHETERS) IMPLANT
CATH URET 5FR 70CM CONE TIP (BALLOONS) IMPLANT
CATH URET DUAL LUMEN 6-10FR 50 (CATHETERS) ×3 IMPLANT
CLOTH BEACON ORANGE TIMEOUT ST (SAFETY) ×3 IMPLANT
DRAPE CAMERA CLOSED 9X96 (DRAPES) IMPLANT
ELECT REM PT RETURN 9FT ADLT (ELECTROSURGICAL)
ELECTRODE REM PT RTRN 9FT ADLT (ELECTROSURGICAL) IMPLANT
GLOVE BIO SURGEON STRL SZ7.5 (GLOVE) ×3 IMPLANT
GLOVE SKINSENSE NS SZ6.5 (GLOVE) ×1
GLOVE SKINSENSE NS SZ7.0 (GLOVE) ×1
GLOVE SKINSENSE STRL SZ6.5 (GLOVE) ×2 IMPLANT
GLOVE SKINSENSE STRL SZ7.0 (GLOVE) ×2 IMPLANT
GOWN PREVENTION PLUS LG XLONG (DISPOSABLE) ×3 IMPLANT
GOWN STRL REIN XL XLG (GOWN DISPOSABLE) ×3 IMPLANT
GUIDEWIRE 0.038 PTFE COATED (WIRE) IMPLANT
GUIDEWIRE ANG ZIPWIRE 038X150 (WIRE) IMPLANT
GUIDEWIRE STR DUAL SENSOR (WIRE) ×3 IMPLANT
IV NS IRRIG 3000ML ARTHROMATIC (IV SOLUTION) ×6 IMPLANT
KIT BALLIN UROMAX 15FX10 (LABEL) IMPLANT
KIT BALLN UROMAX 15FX4 (MISCELLANEOUS) IMPLANT
KIT BALLN UROMAX 26 75X4 (MISCELLANEOUS)
PACK CYSTOSCOPY (CUSTOM PROCEDURE TRAY) ×3 IMPLANT
SET HIGH PRES BAL DIL (LABEL)
SHEATH ACCESS URETERAL 38CM (SHEATH) IMPLANT
SHEATH ACCESS URETERAL 54CM (SHEATH) IMPLANT
SHEATH URET ACCESS 12FR/35CM (UROLOGICAL SUPPLIES) IMPLANT
SHEATH URET ACCESS 12FR/55CM (UROLOGICAL SUPPLIES) IMPLANT
STENT URET 6FRX24 CONTOUR (STENTS) ×3 IMPLANT
SYRINGE IRR TOOMEY STRL 70CC (SYRINGE) IMPLANT

## 2012-07-22 NOTE — Anesthesia Procedure Notes (Addendum)
Procedure Name: Intubation Date/Time: 07/22/2012 1:07 PM Performed by: Norva Pavlov Pre-anesthesia Checklist: Patient identified, Emergency Drugs available, Suction available and Patient being monitored Patient Re-evaluated:Patient Re-evaluated prior to inductionOxygen Delivery Method: Circle System Utilized Preoxygenation: Pre-oxygenation with 100% oxygen Intubation Type: IV induction and Cricoid Pressure applied Laryngoscope Size: Mac and 4 Tube type: Oral Tube size: 8.0 mm Number of attempts: 1 Airway Equipment and Method: stylet and LTA kit utilized Placement Confirmation: ETT inserted through vocal cords under direct vision,  positive ETCO2 and breath sounds checked- equal and bilateral Secured at: 22 cm Tube secured with: Tape Dental Injury: Teeth and Oropharynx as per pre-operative assessment  Future Recommendations: Recommend- awake intubation Comments: Pt. Ate TUMS at 11:30.   Performed by: Norva Pavlov

## 2012-07-22 NOTE — Op Note (Signed)
Preoperative diagnosis: Right ureteral stone, right hydronephrosis Postoperative diagnosis: Right ureteral stone right hydronephrosis  Procedure: Cystoscopy Right retrograde pyelogram Right ureteroscopy Stone basket extraction Right ureteral stent placement with tether  Surgeon: Breckon Reeves  Type of anesthesia: Gen.  Findings: Right retrograde pyelogram-on scout imaging Contrast was still seen in the sigmoid colon consistent with patient's complaints of constipation. The bowel gas pattern otherwise appeared normal. There was a filling defect in the distal ureter at the ureterovesical junction about 2 mm in size consistent with the stone in proximal to this there is mild dilation of the ureter.  Description of procedure: After consent was obtained patient brought to the operating room. He was placed in lithotomy position and prepped and draped in the usual sterile fashion. A timeout was performed to confirm the patient and procedure. Cystoscope was passed per urethra the bladder was inspected and noted to be without mass lesion or stone. The urethra also appeared normal. The right ureteral orifice was cannulated with a 6 [place] open-ended catheter and a gentle injection of retrograde contrast was performed. A sensor wire was then advanced and cold in the collecting system. The rigid ureteroscope was advanced adjacent to the wire but the ureterovesical junction was too narrow to negotiate into the lumen of the ureter. Therefore the ureteroscope was removed and a dual-lumen exchange catheter was advanced over the wire to dilate the ureterovesical junction. The ureteroscope was then advanced into the distal ureter without difficulty with a small stone was found. It was ensnared and a [employer] and removed without difficulty. Reinspection of the distal ureter and intramural ureter noted to be normal without injury. There was some mild hematuria from dilating the ureter therefore I decided to leave a  stent. The wire was backloaded on the cystoscope and a 6 x 26 cm stent was advanced. The wire was removed a good coil seen in the kidney and a good coil in the bladder. The bladder was drained and the scope removed. The patient was awakened and taken to recovery room in stable condition.  Complications: None  Blood loss: Minimal  Specimens: Stone Given to patient.  Drains: 6 x 26 cm right double-J ureteral stent with tether  Disposition: Patient stable to PACU.

## 2012-07-22 NOTE — Transfer of Care (Signed)
Immediate Anesthesia Transfer of Care Note  Patient: Jesse Poole  Procedure(s) Performed: Procedure(s) (LRB): CYSTOSCOPY WITH URETEROSCOPY AND STENT PLACEMENT (Right) HOLMIUM LASER APPLICATION (Right)  Patient Location: PACU  Anesthesia Type: General  Level of Consciousness: awake, alert  and oriented  Airway & Oxygen Therapy: Patient Spontanous Breathing and Patient connected to face mask oxygen  Post-op Assessment: Report given to PACU RN and Post -op Vital signs reviewed and stable  Post vital signs: Reviewed and stable  Complications: No apparent anesthesia complications

## 2012-07-22 NOTE — Anesthesia Preprocedure Evaluation (Signed)
Anesthesia Evaluation  Patient identified by MRN, date of birth, ID band Patient awake    Reviewed: Allergy & Precautions, H&P , NPO status , Patient's Chart, lab work & pertinent test results, reviewed documented beta blocker date and time   History of Anesthesia Complications (+) PONV  Airway Mallampati: II TM Distance: >3 FB Neck ROM: Full    Dental No notable dental hx.    Pulmonary neg pulmonary ROS, sleep apnea , Current Smoker,  breath sounds clear to auscultation  Pulmonary exam normal       Cardiovascular hypertension, Pt. on medications and Pt. on home beta blockers Rhythm:Regular Rate:Normal     Neuro/Psych negative neurological ROS  negative psych ROS   GI/Hepatic Neg liver ROS, GERD-  Medicated and Poorly Controlled,  Endo/Other  negative endocrine ROS  Renal/GU negative Renal ROS  negative genitourinary   Musculoskeletal negative musculoskeletal ROS (+)   Abdominal   Peds negative pediatric ROS (+)  Hematology negative hematology ROS (+)   Anesthesia Other Findings   Reproductive/Obstetrics negative OB ROS                           Anesthesia Physical Anesthesia Plan  ASA: II  Anesthesia Plan: General   Post-op Pain Management:    Induction: Intravenous, Rapid sequence and Cricoid pressure planned  Airway Management Planned: Oral ETT  Additional Equipment:   Intra-op Plan:   Post-operative Plan: Extubation in OR  Informed Consent: I have reviewed the patients History and Physical, chart, labs and discussed the procedure including the risks, benefits and alternatives for the proposed anesthesia with the patient or authorized representative who has indicated his/her understanding and acceptance.   Dental advisory given  Plan Discussed with: CRNA  Anesthesia Plan Comments: (Tums at 1130. RSI)        Anesthesia Quick Evaluation

## 2012-07-22 NOTE — Interval H&P Note (Signed)
History and Physical Interval Note:  07/22/2012 1:21 PM  Jesse Poole  has presented today for surgery, with the diagnosis of Right Ureteral Stone  The various methods of treatment have been discussed with the patient and family. After consideration of risks, benefits and other options for treatment, the patient has consented to  Procedure(s) (LRB) with comments: CYSTOSCOPY WITH URETEROSCOPY AND STENT PLACEMENT (Right) HOLMIUM LASER APPLICATION (Right) as a surgical intervention . He is still having flank pain radiating down into RLQ and bladder area. The patient's history has been reviewed, patient examined, no change in status, stable for surgery. We also discussed side effects of the proposed treatment, the likelihood of the patient achieving the goals of the procedure, and any potential problems that might occur during the procedure or recuperation. Questions were answered to the patient's satisfaction.  He elects to proceed.    Antony Haste

## 2013-02-23 ENCOUNTER — Emergency Department (HOSPITAL_BASED_OUTPATIENT_CLINIC_OR_DEPARTMENT_OTHER)
Admission: EM | Admit: 2013-02-23 | Discharge: 2013-02-23 | Disposition: A | Discharge status: Home / Self Care | Payer: Worker's Compensation | Attending: Emergency Medicine | Admitting: Emergency Medicine

## 2013-02-23 ENCOUNTER — Emergency Department (HOSPITAL_BASED_OUTPATIENT_CLINIC_OR_DEPARTMENT_OTHER): Payer: Worker's Compensation

## 2013-02-23 ENCOUNTER — Encounter (HOSPITAL_BASED_OUTPATIENT_CLINIC_OR_DEPARTMENT_OTHER): Payer: Self-pay | Admitting: *Deleted

## 2013-02-23 DIAGNOSIS — Z91040 Latex allergy status: Secondary | ICD-10-CM | POA: Insufficient documentation

## 2013-02-23 DIAGNOSIS — S61409A Unspecified open wound of unspecified hand, initial encounter: Secondary | ICD-10-CM | POA: Insufficient documentation

## 2013-02-23 DIAGNOSIS — Y929 Unspecified place or not applicable: Secondary | ICD-10-CM | POA: Insufficient documentation

## 2013-02-23 DIAGNOSIS — F17200 Nicotine dependence, unspecified, uncomplicated: Secondary | ICD-10-CM | POA: Insufficient documentation

## 2013-02-23 DIAGNOSIS — Y939 Activity, unspecified: Secondary | ICD-10-CM | POA: Insufficient documentation

## 2013-02-23 DIAGNOSIS — Z79899 Other long term (current) drug therapy: Secondary | ICD-10-CM | POA: Insufficient documentation

## 2013-02-23 DIAGNOSIS — S61411A Laceration without foreign body of right hand, initial encounter: Secondary | ICD-10-CM

## 2013-02-23 DIAGNOSIS — I10 Essential (primary) hypertension: Secondary | ICD-10-CM | POA: Insufficient documentation

## 2013-02-23 DIAGNOSIS — W278XXA Contact with other nonpowered hand tool, initial encounter: Secondary | ICD-10-CM | POA: Insufficient documentation

## 2013-02-23 DIAGNOSIS — F411 Generalized anxiety disorder: Secondary | ICD-10-CM | POA: Insufficient documentation

## 2013-02-23 DIAGNOSIS — K219 Gastro-esophageal reflux disease without esophagitis: Secondary | ICD-10-CM | POA: Insufficient documentation

## 2013-02-23 MED ORDER — CEPHALEXIN 250 MG PO CAPS
500.0000 mg | ORAL_CAPSULE | Freq: Once | ORAL | Status: AC
  Administered 2013-02-23: 500 mg via ORAL
  Filled 2013-02-23: qty 2

## 2013-02-23 MED ORDER — CEPHALEXIN 500 MG PO CAPS: 500.0000 mg | ORAL_CAPSULE | Freq: Four times a day (QID) | ORAL | Status: DC

## 2013-02-23 NOTE — ED Provider Notes (Signed)
CSN: [id number]     Arrival date & time 02/23/13  0141 History   First MD Initiated Contact with Patient 02/23/13 0205     Chief Complaint  Patient presents with  . Hand Injury   (Consider location/radiation/quality/duration/timing/severity/associated sxs/prior Treatment) Patient is a 47 y.o. male presenting with hand injury. The history is provided by the patient. No language interpreter was used.  Hand Injury Location:  Hand Injury: yes   Mechanism of injury comment:  Puncture between right thumb and index finger with screwdriver Hand location:  R hand Pain details:    Quality:  Aching   Radiates to:  Does not radiate   Severity:  Mild   Onset quality:  Sudden   Timing:  Constant   Progression:  Unchanged Chronicity:  New Tetanus status:  Up to date Prior injury to area:  Yes Relieved by:  Nothing Worsened by:  Nothing tried Ineffective treatments:  None tried Associated symptoms: no neck pain   Risk factors: no concern for non-accidental trauma     Past Medical History  Diagnosis Date  . Anxiety   . Hypertension   . GERD (gastroesophageal reflux disease)   . Complication of anesthesia   . PONV (postoperative nausea and vomiting) 1990's    severe migraine on awakening also  . Sleep apnea    Past Surgical History  Procedure Laterality Date  . Epididymectomy  1996  . Lymph node dissection  1970's  . Tonsillectomy  child  . Bronchoscopic removal of foreign body  child    peanut removed   Family History  Problem Relation Age of Onset  . COPD Mother   . Heart failure Father    History  Substance Use Topics  . Smoking status: Current Every Day Smoker -- 0.25 packs/day    Types: Cigarettes  . Smokeless tobacco: Not on file  . Alcohol Use: No    Review of Systems  HENT: Negative for neck pain.   All other systems reviewed and are negative.    Allergies  Latex; Percocet; and Nsaids  Home Medications   Current Outpatient Rx  Name  Route  Sig  Dispense   Refill  . ALPRAZolam (XANAX) 1 MG tablet   Oral   Take 1 mg by mouth 3 (three) times daily as needed. For anxiety         . ciprofloxacin (CIPRO) 500 MG tablet   Oral   Take 1 tablet (500 mg total) by mouth every 12 (twelve) hours.   10 tablet   0   . HYDROmorphone (DILAUDID) 4 MG tablet   Oral   Take 0.5-1 tablets (2-4 mg total) by mouth every 6 (six) hours as needed for pain.   30 tablet   0   . metoprolol (LOPRESSOR) 50 MG tablet   Oral   Take 50 mg by mouth daily.          Marland Kitchen omeprazole (PRILOSEC) 20 MG capsule   Oral   Take 20 mg by mouth daily.         Marland Kitchen oxyCODONE-acetaminophen (PERCOCET/ROXICET) 5-325 MG per tablet   Oral   Take 2 tablets by mouth every 4 (four) hours as needed for pain.   20 tablet   0   . oxyCODONE-acetaminophen (ROXICET) 5-325 MG per tablet   Oral   Take 1 tablet by mouth every 4 (four) hours as needed for pain.   15 tablet   0   . promethazine (PHENERGAN) 25 MG tablet  Oral   Take 25 mg by mouth every 6 (six) hours as needed.         . silodosin (RAPAFLO) 4 MG CAPS capsule   Oral   Take 8 mg by mouth daily with breakfast.          BP 151/87  Pulse 82  Temp(Src) 99.2 F (37.3 C) (Oral)  Resp 18  Ht 5\' 8"  (1.727 m)  Wt 200 lb (90.719 kg)  BMI 30.42 kg/m2  SpO2 98% Physical Exam  Constitutional: He is oriented to person, place, and time. He appears well-developed and well-nourished.  HENT:  Head: Normocephalic and atraumatic.  Eyes: Conjunctivae are normal. Pupils are equal, round, and reactive to light.  Neck: Normal range of motion. Neck supple.  Cardiovascular: Normal rate, regular rhythm and intact distal pulses.   Pulmonary/Chest: Effort normal and breath sounds normal.  Abdominal: Soft. Bowel sounds are normal. There is no tenderness. There is no rebound and no guarding.  Musculoskeletal: Normal range of motion.  7 mm puncture between thumb and index finger of right hand.  Thumb and hand neurovascularly intact  FROM of all digits including thumb.  Cap refill < 2 sec to all digits  Neurological: He is alert and oriented to person, place, and time.  Skin: Skin is warm and dry.  Psychiatric: He has a normal mood and affect.    ED Course  Procedures (including critical care time) Labs Review Labs Reviewed - No data to display Imaging Review No results found.  MDM  No diagnosis found. LACERATION REPAIR Performed by: Jasmine Awe Authorized by: Jasmine Awe Consent: Verbal consent obtained. Risks and benefits: risks, benefits and alternatives were discussed Consent given by: patient Patient identity confirmed: provided demographic data Prepped and Draped in normal sterile fashion Wound explored  Laceration Location: space between thumb and index finger of right hand  Laceration Length: . 7cm  No Foreign Bodies seen or palpated  Anesthesia: local infiltration  Local anesthetic: lidocaine 1%   Anesthetic total: 3 ml  Irrigation method: syringe Amount of cleaning: standard  Skin closure: 3 ethilon  Number of sutures: 1  Technique: interrupted  Patient tolerance: Patient tolerated the procedure well with no immediate complications.  Follow up with hand for any complications.  Will place on keflex due to kind of instrument and depth of wound.  Suture removal in 10 days     Talyah Seder Smitty Cords, MD 02/23/13 [phone]

## 2013-02-23 NOTE — ED Notes (Signed)
MD at bedside treating wound

## 2013-02-23 NOTE — ED Notes (Signed)
Pt has a puncture wound in his right hand between his thumb and index finger from a screwdriver.

## 2013-12-22 ENCOUNTER — Telehealth: Payer: Self-pay

## 2013-12-22 ENCOUNTER — Encounter: Payer: Self-pay | Admitting: Neurology

## 2013-12-22 ENCOUNTER — Ambulatory Visit (INDEPENDENT_AMBULATORY_CARE_PROVIDER_SITE_OTHER): Payer: BC Managed Care – PPO | Admitting: Neurology

## 2013-12-22 ENCOUNTER — Encounter (INDEPENDENT_AMBULATORY_CARE_PROVIDER_SITE_OTHER): Payer: Self-pay

## 2013-12-22 VITALS — BP 149/104 | HR 69 | Ht 68.0 in | Wt 222.0 lb

## 2013-12-22 DIAGNOSIS — R42 Dizziness and giddiness: Secondary | ICD-10-CM

## 2013-12-22 DIAGNOSIS — R269 Unspecified abnormalities of gait and mobility: Secondary | ICD-10-CM

## 2013-12-22 NOTE — Progress Notes (Signed)
PATIENT: Jesse Poole DOB: 06/10/1966  HISTORICAL  Jesse Poole is a 48 years old right-handed male, referred by his primary care PA Jesse Poole for evaluation of acute onset vertigo, gait difficulty  He had past medical history of hypertension, in July 4th 2015, while sitting at the bed, he noticed sudden onset occipital area headaches, later he noticed that onset vertigo, he is surrounding slow-moving from the left side, to the right side, he had nausea, vomiting, gait difficulty, ambulance was called, [facility],  [facility] showed normal CBC, CMP, UA, negative UDS. CT head was normal, he was discharged with aspirin 81 mg daily  Over the past 1 weeks, he has mild improvement, but he continued to have dizziness, unsteady gait, difficulty focusing, frequent bilateral frontal headaches,  REVIEW OF SYSTEMS: Full 14 system review of systems performed and notable only for blurry vision, hearing loss, incontinence, feeling hot, headaches, dizziness, anxiety, blood in stool,  ALLERGIES: Allergies  Allergen Reactions  . Latex Rash  . Percocet [Oxycodone-Acetaminophen] Nausea Only  . Nsaids Other (See Comments)    Po forms  aggrevates his GERD    HOME MEDICATIONS: Current Outpatient Prescriptions on File Prior to Visit  Medication Sig Dispense Refill  . ALPRAZolam (XANAX) 1 MG tablet Take 1 mg by mouth 3 (three) times daily as needed. For anxiety      . HYDROmorphone (DILAUDID) 4 MG tablet Take 0.5-1 tablets (2-4 mg total) by mouth every 6 (six) hours as needed for pain.  30 tablet  0  . metoprolol (LOPRESSOR) 50 MG tablet Take 50 mg by mouth daily.       Marland Kitchen. omeprazole (PRILOSEC) 20 MG capsule Take 20 mg by mouth daily.       No current facility-administered medications on file prior to visit.    PAST MEDICAL HISTORY: Past Medical History  Diagnosis Date  . Anxiety   . Hypertension   . GERD (gastroesophageal reflux disease)   .  Complication of anesthesia   . PONV (postoperative nausea and vomiting) 1990's    severe migraine on awakening also  . Sleep apnea     PAST SURGICAL HISTORY: Past Surgical History  Procedure Laterality Date  . Epididymectomy  1996  . Lymph node dissection  1970's  . Tonsillectomy  child  . Bronchoscopic removal of foreign body  child    peanut removed    FAMILY HISTORY: Family History  Problem Relation Age of Onset  . COPD Mother   . Heart failure Father     SOCIAL HISTORY:  History   Social History  . Marital Status: Single    Spouse Name: N/A    Number of Children: 2  . Years of Education: 12   Occupational History  .      Jesse Poole   Social History Main Topics  . Smoking status: Current Every Day Smoker -- 0.25 packs/day    Types: Cigarettes  . Smokeless tobacco: Never Used  . Alcohol Use: No  . Drug Use: No  . Sexual Activity: Not on file   Other Topics Concern  . Not on file   Social History Narrative   Patient lives at home with his Jesse Poole and works full time for [employer].Patient has a high school education.   Patient is right handed.   Caffeine daily tea and soda.     PHYSICAL EXAM   Filed Vitals:   12/22/13 0811  BP: 149/104  Pulse: 69  Height: 5\' 8"  (1.727 m)  Weight: 222 lb (100.699 kg)    Not recorded    Body mass index is 33.76 kg/(m^2).   Generalized: In no acute distress  Neck: Supple, no carotid bruits   Cardiac: Regular rate rhythm  Pulmonary: Clear to auscultation bilaterally  Musculoskeletal: No deformity  Neurological examination  Mentation: Alert oriented to time, place, history taking, and causual conversation  Cranial nerve II-XII: Pupils were equal round reactive to light. Extraocular movements were full. He has left beating horizontal nystagmus,  Visual field were full on confrontational test. Bilateral fundi were sharp.  Facial sensation and strength were normal. Hearing was intact to finger rubbing bilaterally. Uvula  tongue midline.  Head turning and shoulder shrug and were normal and symmetric.Tongue protrusion into cheek strength was normal.  Motor: Normal tone, bulk and strength.  Sensory: Intact to fine touch, pinprick, preserved vibratory sensation, and proprioception at toes.  Coordination: Normal finger to nose, heel-to-shin bilaterally, he has mild trunk ataxia, tends to lean towards the left side,  Gait: Rising up from seated position without assistance wide-based, cautious gait, mild difficulty with tandem walking Romberg signs: He has trunk ataxia, lean towards left side  Deep tendon reflexes: Brachioradialis 2/2, biceps 2/2, triceps 2/2, patellar 2/2, Achilles 2/2, plantar responses were flexor bilaterally.   DIAGNOSTIC DATA (LABS, IMAGING, TESTING) - I reviewed patient records, labs, notes, testing and imaging myself where available.  Lab Results  Component Value Date   WBC 10.3 07/18/2012   HGB 14.3 07/22/2012   HCT 42.0 07/22/2012   MCV 85.7 07/18/2012   PLT 151 07/18/2012      Component Value Date/Time   NA 142 07/22/2012 1240   K 3.7 07/22/2012 1240   CL 103 07/18/2012 2243   CO2 23 07/18/2012 2243   GLUCOSE 111* 07/22/2012 1240   BUN 13 07/18/2012 2243   CREATININE 1.20 07/18/2012 2243   CALCIUM 8.8 07/18/2012 2243   PROT 6.4 07/18/2012 2243   ALBUMIN 3.9 07/18/2012 2243   AST 14 07/18/2012 2243   ALT 23 07/18/2012 2243   ALKPHOS 84 07/18/2012 2243   BILITOT 0.5 07/18/2012 2243   GFRNONAA 70* 07/18/2012 2243   GFRAA 81* 07/18/2012 2243   ASSESSMENT AND PLAN  Jesse Poole is a 48 y.o. male with past medical history of hypertension, presenting with acute onset of vertigo, unsteady gait, on examination, he has mild trunk ataxia, lean towards left side, left beating horizontal nystagmus, localized the lesion to left brainstem, cerebellum, most likely due to stroke, need to rule out vertebral artery dissection.  1, MRI of brain 2. CT angiogram of head and neck 3. Keep well hydration, daily aspirin 81 mg 4.  Return to clinic in one week  Jesse Poole, M.D. Ph.D.  [facility] [address], [address] [place], [place] [address] [phone]

## 2013-12-22 NOTE — Telephone Encounter (Signed)
Dr.Yan patient needs another note dated for July 30th. Patient is not having CT until July 17th [facility] that was the soonest they could get him in.  and MRI is still being checked for Insurance. We have appts scheduled to come back and see you July 29 th at 10:00. Patient needs another work note dated for July 30th. You will be back from vacation and he will have MRI , CT's done.  Patient wants work not mailed.

## 2013-12-25 DIAGNOSIS — Z0289 Encounter for other administrative examinations: Secondary | ICD-10-CM

## 2013-12-25 NOTE — Telephone Encounter (Signed)
I have generate an excuse note, please take care.

## 2013-12-26 ENCOUNTER — Encounter: Payer: Self-pay | Admitting: *Deleted

## 2013-12-26 ENCOUNTER — Telehealth: Payer: Self-pay | Admitting: Neurology

## 2013-12-26 NOTE — Telephone Encounter (Signed)
Annabelle HarmanDana, New note was written, please take care of it.

## 2013-12-26 NOTE — Telephone Encounter (Signed)
Sent message to Dr. Terrace ArabiaYan.

## 2013-12-26 NOTE — Telephone Encounter (Signed)
Patient calling to state that he needs the note for work rewritten and the date extended because the one Dr. Terrace ArabiaYan wrote covers him through 01/01/14 but the appointment with Dr. Terrace Arabiayan had to be pushed back to 01/10/14. Please return call to patient and advise.

## 2013-12-26 NOTE — Telephone Encounter (Signed)
Mailed patient work note with change of appt. Time and date.

## 2013-12-27 ENCOUNTER — Ambulatory Visit: Payer: BC Managed Care – PPO | Admitting: Neurology

## 2013-12-27 ENCOUNTER — Ambulatory Visit (INDEPENDENT_AMBULATORY_CARE_PROVIDER_SITE_OTHER): Payer: BC Managed Care – PPO

## 2013-12-27 ENCOUNTER — Telehealth: Payer: Self-pay | Admitting: Neurology

## 2013-12-27 DIAGNOSIS — R42 Dizziness and giddiness: Secondary | ICD-10-CM

## 2013-12-27 DIAGNOSIS — R269 Unspecified abnormalities of gait and mobility: Secondary | ICD-10-CM

## 2013-12-27 NOTE — Telephone Encounter (Signed)
Pt's note was already mailed out.

## 2013-12-27 NOTE — Telephone Encounter (Signed)
Patient is here today for an MRI - He needs to have a work release note until August 3rd and is requesting it be done today before he leaves the MRI

## 2013-12-27 NOTE — Telephone Encounter (Signed)
Please advise previous note. Thanks  °

## 2013-12-28 ENCOUNTER — Ambulatory Visit
Admission: RE | Admit: 2013-12-28 | Discharge: 2013-12-28 | Disposition: A | Discharge status: Home / Self Care | Payer: BC Managed Care – PPO | Source: Ambulatory Visit | Attending: Neurology | Admitting: Neurology

## 2013-12-28 ENCOUNTER — Telehealth: Payer: Self-pay | Admitting: Neurology

## 2013-12-28 DIAGNOSIS — R42 Dizziness and giddiness: Secondary | ICD-10-CM

## 2013-12-28 DIAGNOSIS — R269 Unspecified abnormalities of gait and mobility: Secondary | ICD-10-CM

## 2013-12-28 MED ORDER — IOHEXOL 350 MG/ML SOLN
80.0000 mL | Freq: Once | INTRAVENOUS | Status: AC | PRN
  Administered 2013-12-28: 80 mL via INTRAVENOUS

## 2013-12-28 NOTE — Telephone Encounter (Signed)
Left message for patient to call back for image result.  Please call patient  MRI brain Unremarkable MRI brain (without) demonstrating. Incidental partially empty sella.  CTA of neck and head were normal.   He should keep his follow up in July 29th

## 2013-12-28 NOTE — Telephone Encounter (Signed)
Pt's updated letter was mailed out today.

## 2013-12-28 NOTE — Telephone Encounter (Signed)
Note is done, please take care of it.

## 2013-12-28 NOTE — Telephone Encounter (Signed)
Please call patient for normal CTA head and neck, make sure he is on schedule for MRi brain

## 2013-12-29 MED ORDER — DIVALPROEX SODIUM ER 500 MG PO TB24: 500.0000 mg | ORAL_TABLET | Freq: Every day | ORAL | Status: DC

## 2013-12-29 NOTE — Telephone Encounter (Signed)
Called pt to inform him per Dr. Terrace ArabiaYan that pt's MRI of the brain was unremarkable without demonstrating, incidental partially empty sella and to keep his f/u appt on January 10, 2014.  I advised the pt that if he has any other problems, questions or concerns to call the office. Pt verbalized understanding.

## 2013-12-29 NOTE — Telephone Encounter (Signed)
Called pt to inform him per Dr. Terrace ArabiaYan that the pt's CT scan of the head and neck was normal and that we are waiting for MRI results. I advised the pt that if he has any other problems, questions or concerns to call the office. Pt verbalized understanding. FYI

## 2013-12-29 NOTE — Telephone Encounter (Signed)
Patient returning call to Dr. Terrace ArabiaYan, please call and advise.

## 2013-12-29 NOTE — Telephone Encounter (Signed)
I have called him, he is overall better, walking on boat, if he bend over, he felt dizziness. He has light sensitivity.  MRI brain showed no acute abnormality. CTA neck and head were normal.   Differentiation diagnosis includes a small brainstem stroke, that was missed by MRI scan, and migraine variants, but he has such prolonged course,  Vestibular rehabilitation Depakote ER 500 mg every night Keep followup appointment July 29

## 2014-01-10 ENCOUNTER — Ambulatory Visit (INDEPENDENT_AMBULATORY_CARE_PROVIDER_SITE_OTHER): Payer: BC Managed Care – PPO | Admitting: Neurology

## 2014-01-10 ENCOUNTER — Encounter: Payer: Self-pay | Admitting: Neurology

## 2014-01-10 VITALS — BP 144/93 | HR 93 | Ht 68.0 in | Wt 224.0 lb

## 2014-01-10 DIAGNOSIS — R42 Dizziness and giddiness: Secondary | ICD-10-CM

## 2014-01-10 DIAGNOSIS — R519 Headache, unspecified: Secondary | ICD-10-CM | POA: Insufficient documentation

## 2014-01-10 DIAGNOSIS — R51 Headache: Secondary | ICD-10-CM

## 2014-01-10 DIAGNOSIS — K625 Hemorrhage of anus and rectum: Secondary | ICD-10-CM

## 2014-01-10 DIAGNOSIS — R269 Unspecified abnormalities of gait and mobility: Secondary | ICD-10-CM

## 2014-01-10 NOTE — Progress Notes (Signed)
PATIENT: Jesse Poole DOB: 25-May-1966  HISTORICAL (initial July 10th 2015)  Charlis Harner is a 48 years old right-handed male, referred by his primary care PA [facility] for evaluation of acute onset vertigo, gait difficulty  He had past medical history of hypertension, in July 4th 2015, while sitting at the bed, he noticed sudden onset occipital area headaches, later he noticed that onset vertigo, he is surrounding slow-moving from the left side, to the right side, he had nausea, vomiting, gait difficulty, ambulance was called, he was taken to [facility],  Labs at [facility] showed normal CBC, CMP, UA, negative UDS. CT head was normal, he was discharged with aspirin 81 mg daily  Over the past 1 weeks, he has mild improvement, but he continued to have dizziness, unsteady gait, difficulty focusing, frequent bilateral frontal headaches,  UPDATE July 29th 2015: He still falling down, unsteady to the point that he sneezed and fell, he has to hold on to the wall to go up staires, turning around,  He looses his balance,   He denies neck pain, he has constipation, no incontinence, he denies pain.   He continues to have headaches, 1-3 times a day, lasting one hour, he plays online computer games, 5 hours each day, he sees flashing light, followed by headache, bilateral temporal, parietal area, pressure pain,  With associated noise sensitivity, nauseous.   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   The head reviewed MRI of the brain, which was essentially normal, there is no evidence of acute stroke, CT angiogram of head and neck showed no evidence of large vessel disease     REVIEW OF SYSTEMS: Full 14 system review of systems performed and notable only for unsteady gait, frequent headaches, activity changes, appetite change, fatigue, turning, and light sensitivity, double vision, palpitation, rectal bleeding, constipation, [place],  insomnia, snoring, sleep talking, breathing difficulty, memory loss, dizziness, headaches, agitations  ALLERGIES: Allergies  Allergen Reactions  . Latex Rash  . Percocet [Oxycodone-Acetaminophen] Nausea Only  . Nsaids Other (See Comments)    Po forms  aggrevates his GERD    HOME MEDICATIONS: Current Outpatient Prescriptions on File Prior to Visit  Medication Sig Dispense Refill  . ALPRAZolam (XANAX) 1 MG tablet Take 1 mg by mouth 3 (three) times daily as needed. For anxiety      . aspirin 81 MG tablet Take 81 mg by mouth daily.      . diazepam (VALIUM) 5 MG tablet 5 mg daily.      . divalproex (DEPAKOTE ER) 500 MG 24 hr tablet Take 1 tablet (500 mg total) by mouth daily.  30 tablet  6  . meclizine (ANTIVERT) 25 MG tablet 25 mg daily.      . metoprolol succinate (TOPROL-XL) 50 MG 24 hr tablet 50 mg daily.      Marland Kitchen omeprazole (PRILOSEC) 20 MG capsule Take 20 mg by mouth daily.       No current facility-administered medications on file prior to visit.    PAST MEDICAL HISTORY: Past Medical History  Diagnosis Date  . Anxiety   . Hypertension   . GERD (gastroesophageal reflux disease)   . Complication of anesthesia   . PONV (postoperative nausea and vomiting) 1990's    severe migraine on awakening also  . Sleep apnea     PAST SURGICAL HISTORY: Past Surgical History  Procedure Laterality Date  . Epididymectomy  1996  . Lymph node dissection  1970's  .  Tonsillectomy  child  . Bronchoscopic removal of foreign body  child    peanut removed    FAMILY HISTORY: Family History  Problem Relation Age of Onset  . COPD Mother   . Heart failure Father     SOCIAL HISTORY:  History   Social History  . Marital Status: Single    Spouse Name: N/A    Number of Children: 2  . Years of Education: 12   Occupational History  .      [employer]   Social History Main Topics  . Smoking status: Current Every Day Smoker -- 0.25 packs/day    Types: Cigarettes  . Smokeless tobacco: Never  Used  . Alcohol Use: No  . Drug Use: No  . Sexual Activity: Not on file   Other Topics Concern  . Not on file   Social History Narrative   Patient lives at home with his Marchia MeiersFiance and works full time for [employer].Patient has a high school education.   Patient is right handed.   Caffeine daily tea and soda.     PHYSICAL EXAM   Filed Vitals:   01/10/14 0955  BP: 144/93  Pulse: 93  Height: 5\' 8"  (1.727 m)  Weight: 224 lb (101.606 kg)    Not recorded    Body mass index is 34.07 kg/(m^2).   Generalized: In no acute distress  Neck: Supple, no carotid bruits   Cardiac: Regular rate rhythm  Pulmonary: Clear to auscultation bilaterally  Musculoskeletal: No deformity  Neurological examination  Mentation: Alert oriented to time, place, history taking, and causual conversation  Cranial nerve II-XII: Pupils were equal round reactive to light. Extraocular movements were full. He has left beating horizontal nystagmus,  Visual field were full on confrontational test. Bilateral fundi were sharp.  Facial sensation and strength were normal. Hearing was intact to finger rubbing bilaterally. Uvula tongue midline.  Head turning and shoulder shrug and were normal and symmetric.Tongue protrusion into cheek strength was normal.  Motor: Normal tone, bulk and strength.  Sensory: Intact to fine touch, pinprick, preserved vibratory sensation, and proprioception at toes.  Coordination: Normal finger to nose, heel-to-shin bilaterally, he has mild trunk ataxia, tends to lean towards the left side,  Gait: Rising up from seated position without assistance narrow based, fairly steady,, mild difficulty with tandem walking Romberg signs: Negative  Deep tendon reflexes: Brachioradialis 2/2, biceps 2/2, triceps 2/2, patellar 2/2, Achilles 2/2, plantar responses were flexor bilaterally.   DIAGNOSTIC DATA (LABS, IMAGING, TESTING) - I reviewed patient records, labs, notes, testing and imaging myself where  available.  Lab Results  Component Value Date   WBC 10.3 07/18/2012   HGB 14.3 07/22/2012   HCT 42.0 07/22/2012   MCV 85.7 07/18/2012   PLT 151 07/18/2012      Component Value Date/Time   NA 142 07/22/2012 1240   K 3.7 07/22/2012 1240   CL 103 07/18/2012 2243   CO2 23 07/18/2012 2243   GLUCOSE 111* 07/22/2012 1240   BUN 13 07/18/2012 2243   CREATININE 1.20 07/18/2012 2243   CALCIUM 8.8 07/18/2012 2243   PROT 6.4 07/18/2012 2243   ALBUMIN 3.9 07/18/2012 2243   AST 14 07/18/2012 2243   ALT 23 07/18/2012 2243   ALKPHOS 84 07/18/2012 2243   BILITOT 0.5 07/18/2012 2243   GFRNONAA 70* 07/18/2012 2243   GFRAA 81* 07/18/2012 2243   ASSESSMENT AND PLAN  William Hamburgerlbert Rider is a 48 y.o. male with past medical history of hypertension, presenting with acute onset  of vertigo, unsteady gait, he continued complains no unsteady gait, especially sudden positional change, also complains of seeing flashlight, followed by headaches, consistent with migraines, Extensive imaging study detailed above, failed to demonstrate etiology, there was no evidence of stroke,  Differentiation diagnosis including complicated migraine, Continue daily aspirin, Depakote 500 mg every night Proceed with MRI of cervical spine with his continued complaint of gait difficulty Return to clinic in 3 months,     Levert Feinstein, M.D. Ph.D.  [facility] [address], [address] [place], [place] [address] [phone]

## 2014-01-11 ENCOUNTER — Ambulatory Visit: Payer: BC Managed Care – PPO | Attending: Neurology | Admitting: Rehabilitative and Restorative Service Providers"

## 2014-01-11 DIAGNOSIS — IMO0001 Reserved for inherently not codable concepts without codable children: Secondary | ICD-10-CM | POA: Diagnosis present

## 2014-01-11 DIAGNOSIS — R42 Dizziness and giddiness: Secondary | ICD-10-CM | POA: Diagnosis not present

## 2014-01-16 ENCOUNTER — Telehealth: Payer: Self-pay | Admitting: *Deleted

## 2014-01-16 NOTE — Telephone Encounter (Signed)
Patient forms at front desk.

## 2014-01-24 ENCOUNTER — Ambulatory Visit: Payer: BC Managed Care – PPO | Attending: Neurology | Admitting: Rehabilitative and Restorative Service Providers"

## 2014-01-24 DIAGNOSIS — R42 Dizziness and giddiness: Secondary | ICD-10-CM | POA: Diagnosis not present

## 2014-01-24 DIAGNOSIS — IMO0001 Reserved for inherently not codable concepts without codable children: Secondary | ICD-10-CM | POA: Diagnosis not present

## 2014-01-25 ENCOUNTER — Ambulatory Visit (INDEPENDENT_AMBULATORY_CARE_PROVIDER_SITE_OTHER): Payer: BC Managed Care – PPO

## 2014-01-25 DIAGNOSIS — R269 Unspecified abnormalities of gait and mobility: Secondary | ICD-10-CM

## 2014-01-25 DIAGNOSIS — R51 Headache: Secondary | ICD-10-CM

## 2014-01-25 DIAGNOSIS — K625 Hemorrhage of anus and rectum: Secondary | ICD-10-CM

## 2014-01-25 DIAGNOSIS — R42 Dizziness and giddiness: Secondary | ICD-10-CM

## 2014-01-30 NOTE — Progress Notes (Signed)
Quick Note:  WID Dr.Yan out of the office until 02-01-2014 ______ 

## 2014-01-31 ENCOUNTER — Ambulatory Visit: Payer: BC Managed Care – PPO | Admitting: Physical Therapy

## 2014-02-01 ENCOUNTER — Ambulatory Visit: Payer: BC Managed Care – PPO | Admitting: Physical Therapy

## 2014-02-01 DIAGNOSIS — IMO0001 Reserved for inherently not codable concepts without codable children: Secondary | ICD-10-CM | POA: Diagnosis not present

## 2014-02-05 IMAGING — CR DG HIP COMPLETE 2+V*R*
3 series · 3 of 3 positions shown · non-contrast
Comparison: None.

CLINICAL DATA: Right hip pain after fall yesterday.  Right groin
pain.

RIGHT HIP - COMPLETE 2+ VIEW

[t pelvis a.p.]
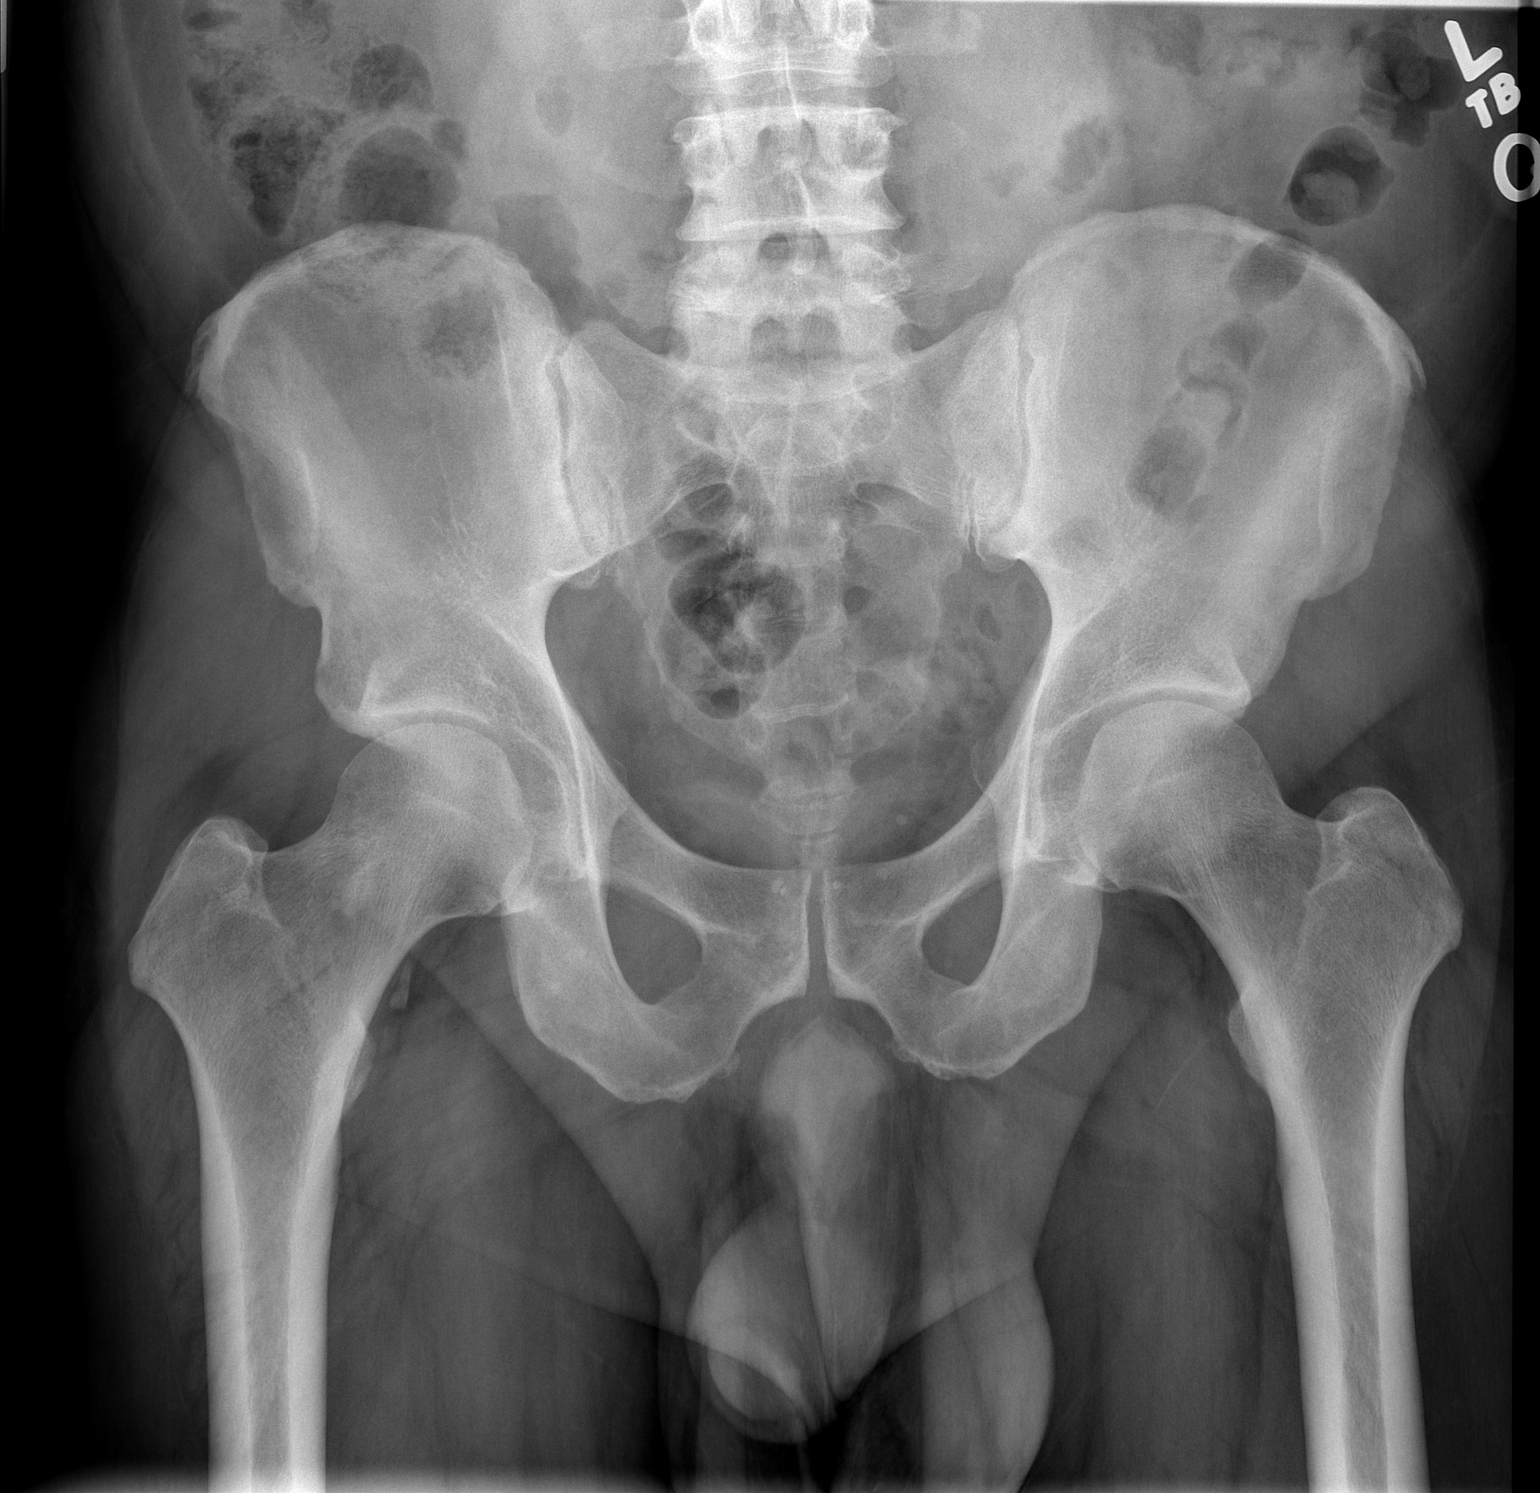

[t hip ap right]
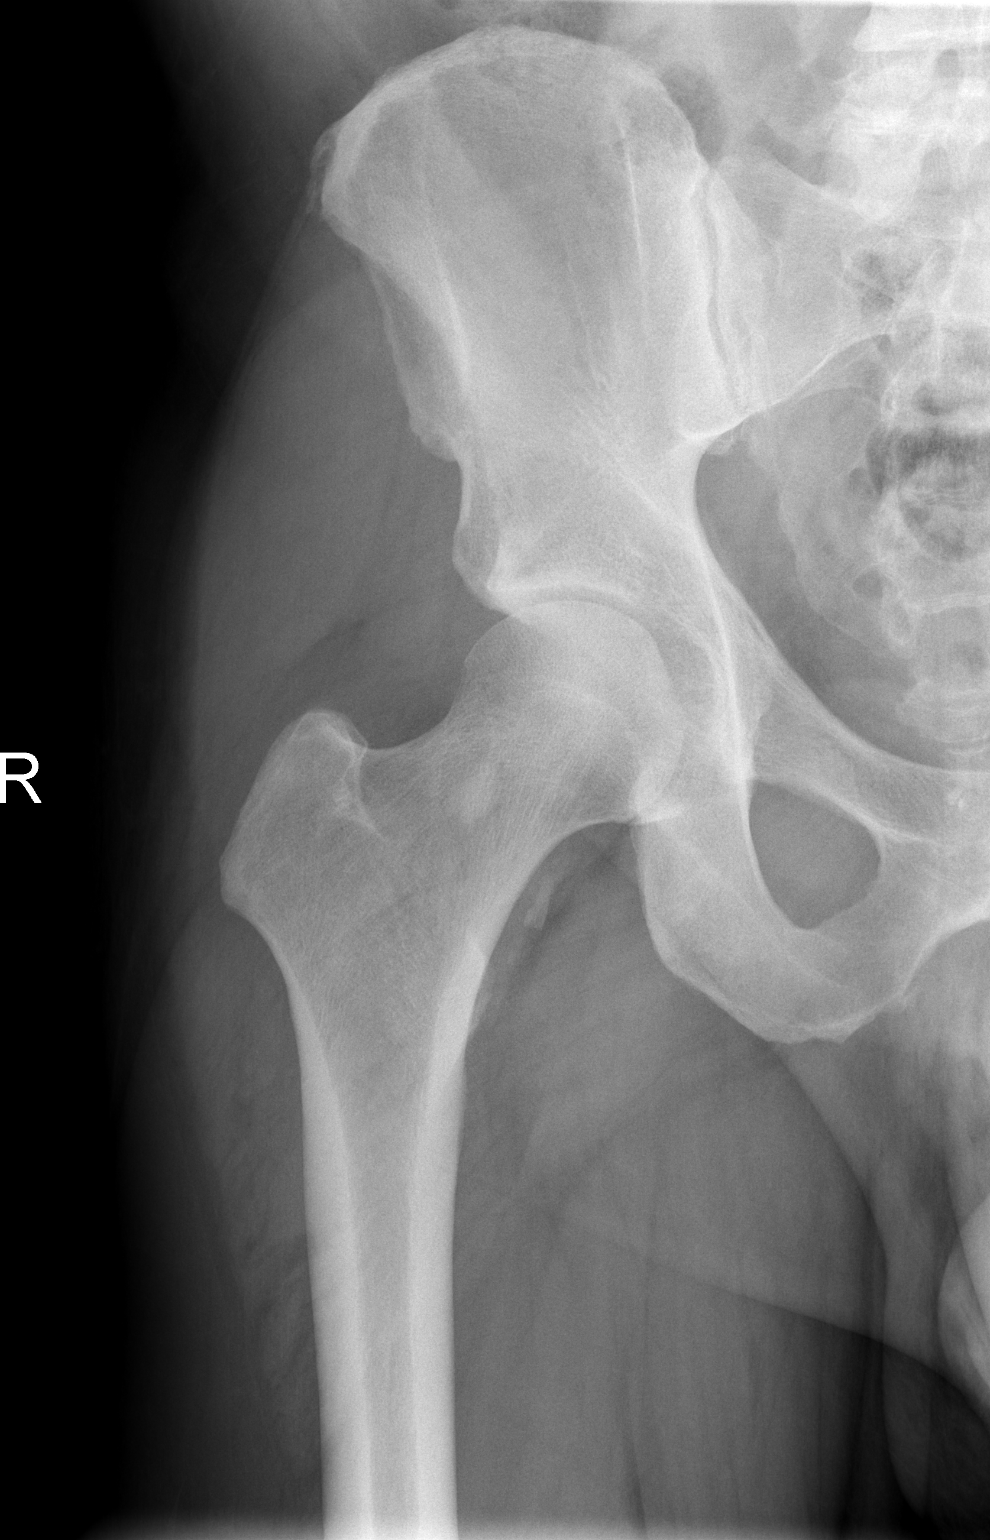

[t hip frog leg right]
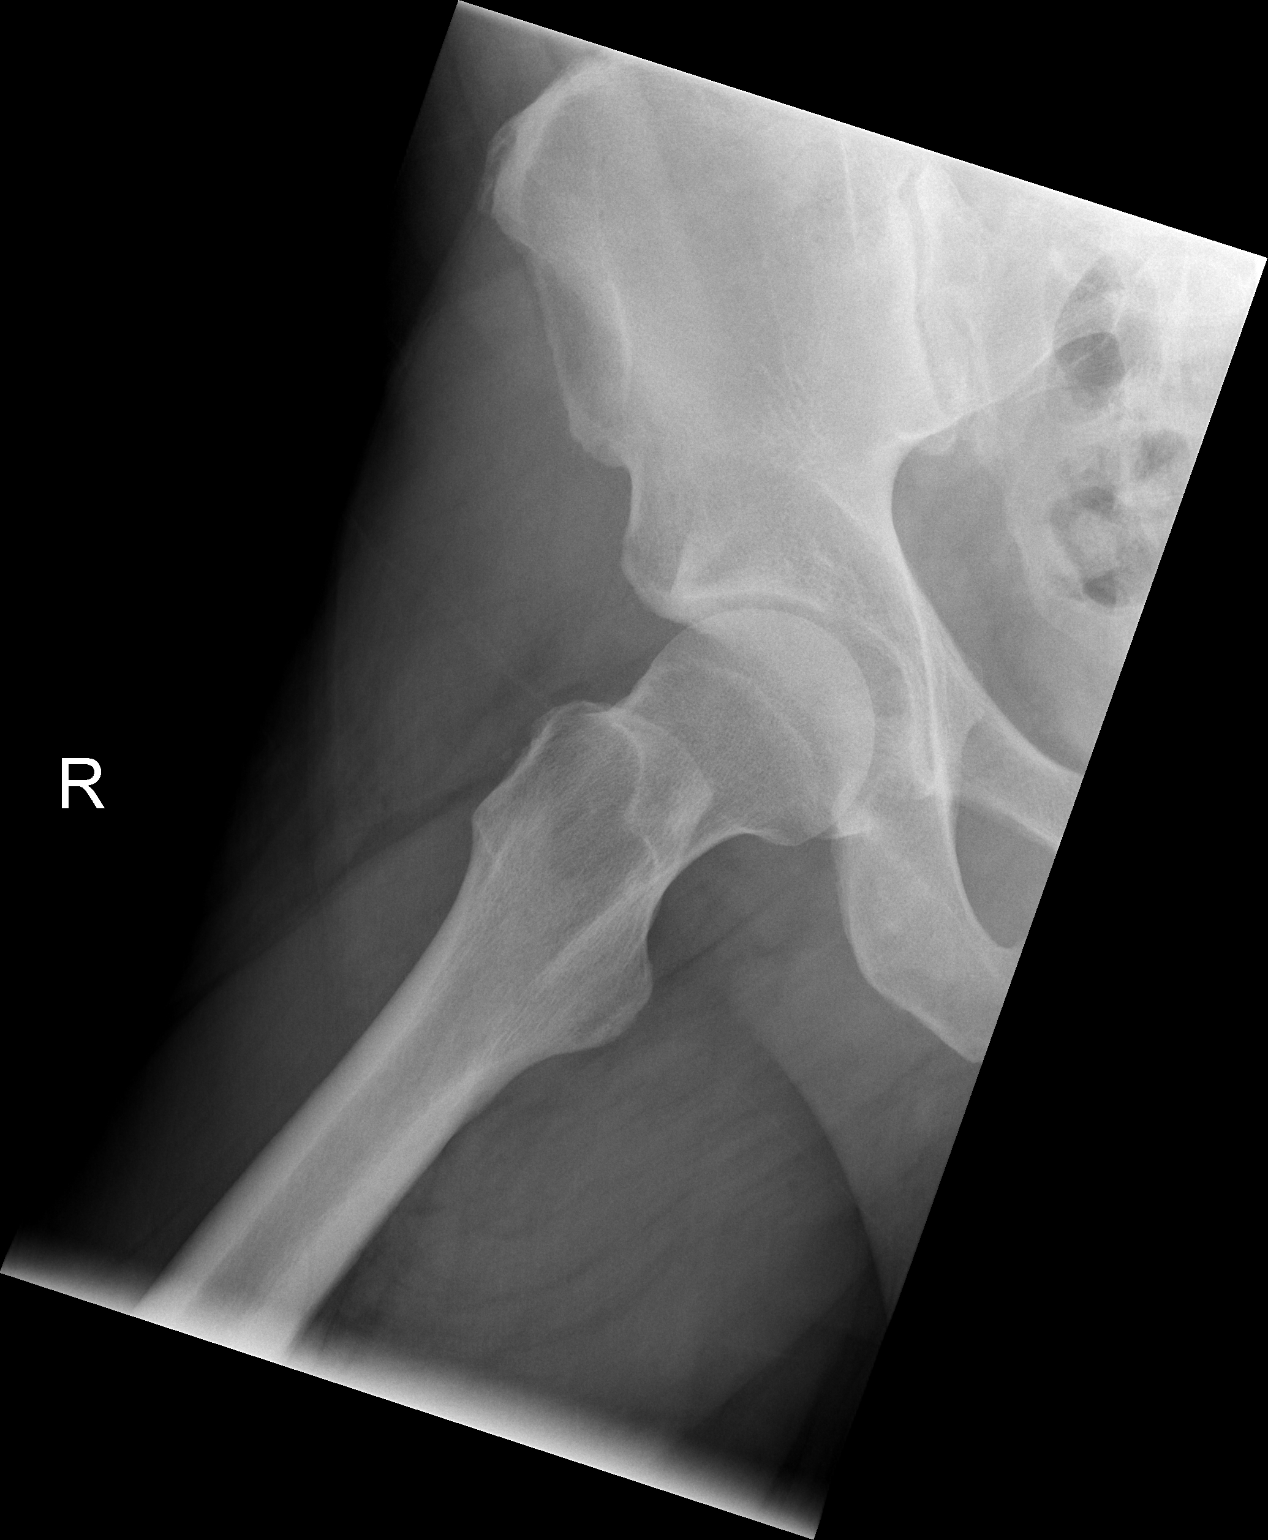

[3 of 3 positions shown; findings below may reference images not displayed]

FINDINGS: There is a focal calcification along the medial aspect of
the right femoral neck which appears to be well corticated.  This
could represent ligamentous calcification but an avulsion fragment
is not excluded.  Mild degenerative changes in the lower lumbar
spine and hips.  Pelvis, sacrum, SI joints, and symphysis pubis
appear intact.  No additional evidence of acute fracture or
subluxation of the right hip.  No focal bone lesion or bone
destruction.
IMPRESSION: No displaced fractures identified.  Nonspecific osseous fragment
medial to the right femoral neck could represent ligamentous
calcification or avulsion fragment.

## 2014-02-07 ENCOUNTER — Ambulatory Visit: Payer: BC Managed Care – PPO | Admitting: Rehabilitative and Restorative Service Providers"

## 2014-02-08 ENCOUNTER — Emergency Department (HOSPITAL_BASED_OUTPATIENT_CLINIC_OR_DEPARTMENT_OTHER)
Admission: EM | Admit: 2014-02-08 | Discharge: 2014-02-08 | Disposition: A | Discharge status: Home / Self Care | Payer: BC Managed Care – PPO | Attending: Emergency Medicine | Admitting: Emergency Medicine

## 2014-02-08 ENCOUNTER — Encounter (HOSPITAL_BASED_OUTPATIENT_CLINIC_OR_DEPARTMENT_OTHER): Payer: Self-pay | Admitting: Emergency Medicine

## 2014-02-08 DIAGNOSIS — I10 Essential (primary) hypertension: Secondary | ICD-10-CM | POA: Insufficient documentation

## 2014-02-08 DIAGNOSIS — R51 Headache: Secondary | ICD-10-CM | POA: Insufficient documentation

## 2014-02-08 DIAGNOSIS — K219 Gastro-esophageal reflux disease without esophagitis: Secondary | ICD-10-CM | POA: Diagnosis not present

## 2014-02-08 DIAGNOSIS — R42 Dizziness and giddiness: Secondary | ICD-10-CM | POA: Insufficient documentation

## 2014-02-08 DIAGNOSIS — Z79899 Other long term (current) drug therapy: Secondary | ICD-10-CM | POA: Insufficient documentation

## 2014-02-08 DIAGNOSIS — F17200 Nicotine dependence, unspecified, uncomplicated: Secondary | ICD-10-CM | POA: Insufficient documentation

## 2014-02-08 DIAGNOSIS — F411 Generalized anxiety disorder: Secondary | ICD-10-CM | POA: Diagnosis not present

## 2014-02-08 DIAGNOSIS — Z7982 Long term (current) use of aspirin: Secondary | ICD-10-CM | POA: Insufficient documentation

## 2014-02-08 DIAGNOSIS — Z91040 Latex allergy status: Secondary | ICD-10-CM | POA: Insufficient documentation

## 2014-02-08 MED ORDER — DEXAMETHASONE SODIUM PHOSPHATE 4 MG/ML IJ SOLN
INTRAMUSCULAR | Status: AC
  Administered 2014-02-08: 10 mg
  Filled 2014-02-08: qty 3

## 2014-02-08 MED ORDER — METOCLOPRAMIDE HCL 5 MG/ML IJ SOLN
5.0000 mg | Freq: Once | INTRAMUSCULAR | Status: AC
  Administered 2014-02-08: 5 mg via INTRAVENOUS
  Filled 2014-02-08: qty 2

## 2014-02-08 MED ORDER — SODIUM CHLORIDE 0.9 % IV BOLUS (SEPSIS)
1000.0000 mL | INTRAVENOUS | Status: AC
  Administered 2014-02-08: 1000 mL via INTRAVENOUS

## 2014-02-08 MED ORDER — DEXAMETHASONE SODIUM PHOSPHATE 10 MG/ML IJ SOLN: 10.0000 mg | Freq: Once | INTRAMUSCULAR | Status: AC

## 2014-02-08 MED ORDER — DIPHENHYDRAMINE HCL 50 MG/ML IJ SOLN
25.0000 mg | Freq: Once | INTRAMUSCULAR | Status: AC
  Administered 2014-02-08: 25 mg via INTRAVENOUS
  Filled 2014-02-08: qty 1

## 2014-02-08 NOTE — Discharge Instructions (Signed)
Dizziness °Dizziness is a common problem. It is a feeling of unsteadiness or light-headedness. You may feel like you are about to faint. Dizziness can lead to injury if you stumble or fall. A person of any age group can suffer from dizziness, but dizziness is more common in older adults. °CAUSES  °Dizziness can be caused by many different things, including: °· Middle ear problems. °· Standing for too long. °· Infections. °· An allergic reaction. °· Aging. °· An emotional response to something, such as the sight of blood. °· Side effects of medicines. °· Tiredness. °· Problems with circulation or blood pressure. °· Excessive use of alcohol or medicines, or illegal drug use. °· Breathing too fast (hyperventilation). °· An irregular heart rhythm (arrhythmia). °· A low red blood cell count (anemia). °· Pregnancy. °· Vomiting, diarrhea, fever, or other illnesses that cause body fluid loss (dehydration). °· Diseases or conditions such as Parkinson's disease, high blood pressure (hypertension), diabetes, and thyroid problems. °· Exposure to extreme heat. °DIAGNOSIS  °Your health care provider will ask about your symptoms, perform a physical exam, and perform an electrocardiogram (ECG) to record the electrical activity of your heart. Your health care provider may also perform other heart or blood tests to determine the cause of your dizziness. These may include: °· Transthoracic echocardiogram (TTE). During echocardiography, sound waves are used to evaluate how blood flows through your heart. °· Transesophageal echocardiogram (TEE). °· Cardiac monitoring. This allows your health care provider to monitor your heart rate and rhythm in real time. °· Holter monitor. This is a portable device that records your heartbeat and can help diagnose heart arrhythmias. It allows your health care provider to track your heart activity for several days if needed. °· Stress tests by exercise or by giving medicine that makes the heart beat  faster. °TREATMENT  °Treatment of dizziness depends on the cause of your symptoms and can vary greatly. °HOME CARE INSTRUCTIONS  °· Drink enough fluids to keep your urine clear or pale yellow. This is especially important in very hot weather. In older adults, it is also important in cold weather. °· Take your medicine exactly as directed if your dizziness is caused by medicines. When taking blood pressure medicines, it is especially important to get up slowly. °¨ Rise slowly from chairs and steady yourself until you feel okay. °¨ In the morning, first sit up on the side of the bed. When you feel okay, stand slowly while holding onto something until you know your balance is fine. °· Move your legs often if you need to stand in one place for a long time. Tighten and relax your muscles in your legs while standing. °· Have someone stay with you for 1-2 days if dizziness continues to be a problem. Do this until you feel you are well enough to stay alone. Have the person call your health care provider if he or she notices changes in you that are concerning. °· Do not drive or use heavy machinery if you feel dizzy. °· Do not drink alcohol. °SEEK IMMEDIATE MEDICAL CARE IF:  °· Your dizziness or light-headedness gets worse. °· You feel nauseous or vomit. °· You have problems talking, walking, or using your arms, hands, or legs. °· You feel weak. °· You are not thinking clearly or you have trouble forming sentences. It may take a friend or family member to notice this. °· You have chest pain, abdominal pain, shortness of breath, or sweating. °· Your vision changes. °· You notice   any bleeding.  You have side effects from medicine that seems to be getting worse rather than better. MAKE SURE YOU:   Understand these instructions.  Will watch your condition.  Will get help right away if you are not doing well or get worse. Document Released: 11/25/2000 Document Revised: 06/06/2013 Document Reviewed: 12/19/2010 [facility]  Patient Information 2015 [place], [place]. This information is not intended to replace advice given to you by your health care provider. Make sure you discuss any questions you have with your health care provider.  Jesse Poole may go back to work.

## 2014-02-08 NOTE — ED Provider Notes (Signed)
CSN: [id number]     Arrival date & time 02/08/14  1817 History   This chart was scribed for Purvis Sheffield, MD by [place] Peace, ED Scribe. The patient was seen in [facility]. The patient's care was started at 7:37 PM.    Chief Complaint  Patient presents with  . Dizziness      Patient is a 48 y.o. male presenting with dizziness. The history is provided by the patient. No language interpreter was used.  Dizziness Associated symptoms: headaches   Associated symptoms: no chest pain, no diarrhea, no nausea, no shortness of breath and no vomiting   HPI Comments: Jesse Poole is a 48 y.o. male who presents to the Emergency Department complaining of dizziness onset earlier today while pt was at work with associated headache. Pt states that the symptoms never go away but he is able to "manage" them. He reports that today, things just got out of hand and he was just not feeling well at all. Pt rates his headache currently as 6/10 onset around 3:00 PM. He states the he feels as if he is floating on a boat, trying to constantly stay steady and keep his balance.  Pt reports similar occurrence during July 4th weekend. Pt reports history MRI of his head, CT scan of head and neck, and MRI of his neck in the past month.He further reports that he has checked with his Neurologist about this reoccurring issue and left without a definite diagnosis. He denies fever, vomiting and diarrhea.  He states that the worse his dizziness gets, the worse his headache gets.   Past Medical History  Diagnosis Date  . Anxiety   . Hypertension   . GERD (gastroesophageal reflux disease)   . Complication of anesthesia   . PONV (postoperative nausea and vomiting) 1990's    severe migraine on awakening also  . Sleep apnea    Past Surgical History  Procedure Laterality Date  . Epididymectomy  1996  . Lymph node dissection  1970's  . Tonsillectomy  child  . Bronchoscopic removal of foreign body  child    peanut removed    Family History  Problem Relation Age of Onset  . COPD Mother   . Heart failure Father    History  Substance Use Topics  . Smoking status: Current Every Day Smoker -- 0.25 packs/day    Types: Cigarettes  . Smokeless tobacco: Never Used  . Alcohol Use: No    Review of Systems  Constitutional: Negative for fever and fatigue.  HENT: Negative for congestion and drooling.   Eyes: Negative for pain.  Respiratory: Negative for cough and shortness of breath.   Cardiovascular: Negative for chest pain.  Gastrointestinal: Negative for nausea, vomiting, abdominal pain and diarrhea.  Genitourinary: Negative for dysuria and hematuria.  Musculoskeletal: Negative for neck pain.  Skin: Negative for color change.  Neurological: Positive for dizziness and headaches.  Hematological: Negative for adenopathy.  Psychiatric/Behavioral: Negative for behavioral problems.  All other systems reviewed and are negative.     Allergies  Latex; Percocet; and Nsaids  Home Medications   Prior to Admission medications   Medication Sig Start Date End Date Taking? Authorizing Provider  ALPRAZolam Prudy Feeler) 1 MG tablet Take 1 mg by mouth 3 (three) times daily as needed. For anxiety    Historical Provider, MD  aspirin 81 MG tablet Take 81 mg by mouth daily.    Historical Provider, MD  diazepam (VALIUM) 5 MG tablet 5 mg daily. 12/17/13   Historical  Provider, MD  divalproex (DEPAKOTE ER) 500 MG 24 hr tablet Take 1 tablet (500 mg total) by mouth daily. 12/29/13   Levert Feinstein, MD  meclizine (ANTIVERT) 25 MG tablet 25 mg daily. 12/17/13   Historical Provider, MD  metoprolol succinate (TOPROL-XL) 50 MG 24 hr tablet 50 mg daily. 11/18/13   Historical Provider, MD  nicotine (NICODERM CQ - DOSED IN MG/24 HOURS) 21 mg/24hr patch 21 patches as directed. 12/25/13   Historical Provider, MD  omeprazole (PRILOSEC) 20 MG capsule Take 20 mg by mouth daily.    Historical Provider, MD   BP 154/93  Pulse 98  Temp(Src) 98 F (36.7 C)  (Oral)  Resp 20  Ht  (1.727 m)  Wt 224 lb (101.606 kg)  BMI 34.07 kg/m2  SpO2 96% Physical Exam  Nursing note and vitals reviewed. Constitutional: He is oriented to person, place, and time. He appears well-developed and well-nourished. No distress.  HENT:  Head: Normocephalic and atraumatic.  Mouth/Throat: Oropharynx is clear and moist.  Eyes: Conjunctivae and EOM are normal. Pupils are equal, round, and reactive to light.  Neck: Normal range of motion. Neck supple. No tracheal deviation present.  Cardiovascular: Normal rate, regular rhythm and normal heart sounds.  Exam reveals no gallop and no friction rub.   No murmur heard. Pulmonary/Chest: Effort normal and breath sounds normal. No respiratory distress. He has no wheezes. He has no rales.  Abdominal: Soft. Bowel sounds are normal. He exhibits no distension. There is no tenderness. There is no rebound and no guarding.  Musculoskeletal: Normal range of motion. He exhibits no edema and no tenderness.  Neurological: He is alert and oriented to person, place, and time.  alert, oriented x3 speech: normal in context and clarity memory: intact grossly cranial nerves II-XII: intact motor strength: full proximally and distally no involuntary movements or tremors sensation: intact to light touch diffusely  cerebellar: finger-to-nose and heel-to-shin intact gait: normal forwards and backwards   Skin: Skin is warm and dry.  Psychiatric: He has a normal mood and affect. His behavior is normal.    ED Course  Procedures (including critical care time) Labs Review Labs Reviewed - No data to display  Results for orders placed during the hospital encounter of 07/22/12  POCT I-STAT 4, (NA,K, GLUC, HGB,HCT)      Result Value Ref Range   Sodium 142  135 - 145 mEq/L   Potassium 3.7  3.5 - 5.1 mEq/L   Glucose, Bld 111 (*) 70 - 99 mg/dL   HCT 16.1  09.6 - 04.5 %   Hemoglobin 14.3  13.0 - 17.0 g/dL   Mr Cervical Spine Wo  Contrast  01/29/2014   [facility]  NEUROIMAGING REPORT   STUDY DATE: 01/25/14  PATIENT NAME: Jesse Poole DOB: [birth year] MRN: [id number]  ORDERING CLINICIAN: Levert Feinstein, MD PhD  CLINICAL HISTORY: 48 year old male with abnormal gait.   EXAM: MRI cervical spine (without)  TECHNIQUE: MRI of the cervical spine was obtained utilizing 3 mm sagittal  slices from the posterior fossa down to the T3-4 level with T1, T2 and  inversion recovery views. In addition 4 mm axial slices from C2-3 down to  T1-2 level were included with T2 and gradient echo views. CONTRAST: no IMAGING SITE: [facility] [address] (1.5 Tesla MRI)   FINDINGS:  On sagittal views the vertebral bodies have normal height and alignment,  except for mild anterior subluxation of C7 on T1 (1-33mm). Mild disc  bulging at  C4-5, C5-6, C6-7, C7-T1. The spinal cord is normal in size and  appearance. The posterior fossa, pituitary gland and paraspinal soft  tissues are unremarkable.    On axial views: C2-3: left uncovertebral joint hypertrophy and left facet hypertrophy with  mild left foraminal stenosis  C3-4: uncovertebral joint hypertrophy and facet hypertrophy with no spinal  stenosis or foraminal narrowing  C4-5: uncovertebral joint hypertrophy and facet hypertrophy with no spinal  stenosis or foraminal narrowing  C5-6: disc bulging and facet hypertrophy with no spinal stenosis or  foraminal narrowing  C6-7: facet hypertrophy with no spinal stenosis or foraminal narrowing  C7-T1: facet hypertrophy with mild right and moderate left foraminal  stenosis   Limited views of the soft tissues of the head and neck are unremarkable.    01/29/2014   Abnormal MRI cervical spine (without) demonstrating: 1. At C7-T1: pseudo-disc bulging, facet arthropathy/hypertrophy with mild  right and moderate left foraminal stenosis. 2. Mild disc bulging at C4-5, C5-6, C6-7, C7-T1.    INTERPRETING PHYSICIAN:  Suanne Marker, MD Certified in Neurology,  Neurophysiology and Neuroimaging  [facility] [address], [address] [place], [place] [address] [phone]      Imaging Review No results found.   EKG Interpretation None     Medications - No data to display  7:46 PM- Treatment plan was discussed with patient who verbalizes understanding and agrees.   MDM   Final diagnoses:  Dizziness    9:58 AM 48 y.o. male pw slight worsening of dizziness today. Initially became dizzy on July 4. Thought to be vertigo. Had extensive workup and eval by neurology including MRI head and CTA head/neck. Also MRI neck. All non-contrib. Slight worsening of dizziness today. Also has mild HA. Will tx HA to see if it helps w/ dizziness. Do not think pt needs stroke work up given extensive workup and ongoing sx.   9:59 AM: Pt feeling much better.  I have discussed the diagnosis/risks/treatment options with the patient and believe the pt to be eligible for discharge home to follow-up with his neurologist. We also discussed returning to the ED immediately if new or worsening sx occur. We discussed the sx which are most concerning (e.g., worsening dizziness, return of HA, fever) that necessitate immediate return. Medications administered to the patient during their visit and any new prescriptions provided to the patient are listed below.  Medications given during this visit Medications  metoCLOPramide (REGLAN) injection 5 mg (5 mg Intravenous Given 02/08/14 2016)  diphenhydrAMINE (BENADRYL) injection 25 mg (25 mg Intravenous Given 02/08/14 2017)  sodium chloride 0.9 % bolus 1,000 mL (0 mLs Intravenous Stopped 02/08/14 2122)  dexamethasone (DECADRON) injection 10 mg (0 mg Intravenous Duplicate 02/08/14 2035)  dexamethasone (DECADRON) 4 MG/ML injection (10 mg  Given 02/08/14 2018)    Discharge Medication List as of 02/08/2014  9:24 PM        I personally performed the services described in this documentation, which was scribed in my presence.  The recorded information has been reviewed and is accurate.    Purvis Sheffield, MD 02/09/14 1000

## 2014-02-08 NOTE — ED Notes (Signed)
Dizziness. Same a couple of weeks ago. He has been checked by Neurology with no definite diagnosis.

## 2014-02-09 ENCOUNTER — Telehealth: Payer: Self-pay | Admitting: Neurology

## 2014-02-09 NOTE — Telephone Encounter (Signed)
Will send to medical Records to send records.

## 2014-02-09 NOTE — Telephone Encounter (Signed)
Dr. Odetta Pink calling regarding getting additional medical records on patient including any MRI or testing done here at [facility].   He is the patient's primary care and only has one office note dated 12-22-2013 and needs more information.  Please fax to [phone]. His phone number is [phone]

## 2014-03-19 ENCOUNTER — Encounter: Payer: Self-pay | Admitting: *Deleted

## 2014-03-19 ENCOUNTER — Telehealth: Payer: Self-pay | Admitting: *Deleted

## 2014-03-19 NOTE — Telephone Encounter (Signed)
Calling patient to r/s appointment, time was changed to 3 pm with NP MM.

## 2014-04-12 ENCOUNTER — Ambulatory Visit: Payer: Self-pay | Admitting: Adult Health

## 2014-04-12 ENCOUNTER — Ambulatory Visit: Payer: BC Managed Care – PPO | Admitting: Adult Health

## 2014-04-16 ENCOUNTER — Encounter: Payer: Self-pay | Admitting: *Deleted

## 2014-06-20 ENCOUNTER — Emergency Department (HOSPITAL_BASED_OUTPATIENT_CLINIC_OR_DEPARTMENT_OTHER)
Admission: EM | Admit: 2014-06-20 | Discharge: 2014-06-20 | Disposition: A | Discharge status: Home / Self Care | Payer: Worker's Compensation | Attending: Emergency Medicine | Admitting: Emergency Medicine

## 2014-06-20 ENCOUNTER — Encounter (HOSPITAL_BASED_OUTPATIENT_CLINIC_OR_DEPARTMENT_OTHER): Payer: Self-pay | Admitting: *Deleted

## 2014-06-20 ENCOUNTER — Emergency Department (HOSPITAL_BASED_OUTPATIENT_CLINIC_OR_DEPARTMENT_OTHER): Payer: Worker's Compensation

## 2014-06-20 DIAGNOSIS — Z91040 Latex allergy status: Secondary | ICD-10-CM | POA: Diagnosis not present

## 2014-06-20 DIAGNOSIS — Y990 Civilian activity done for income or pay: Secondary | ICD-10-CM | POA: Insufficient documentation

## 2014-06-20 DIAGNOSIS — W19XXXA Unspecified fall, initial encounter: Secondary | ICD-10-CM

## 2014-06-20 DIAGNOSIS — F419 Anxiety disorder, unspecified: Secondary | ICD-10-CM | POA: Diagnosis not present

## 2014-06-20 DIAGNOSIS — I10 Essential (primary) hypertension: Secondary | ICD-10-CM | POA: Insufficient documentation

## 2014-06-20 DIAGNOSIS — Y9289 Other specified places as the place of occurrence of the external cause: Secondary | ICD-10-CM | POA: Diagnosis not present

## 2014-06-20 DIAGNOSIS — W010XXA Fall on same level from slipping, tripping and stumbling without subsequent striking against object, initial encounter: Secondary | ICD-10-CM | POA: Insufficient documentation

## 2014-06-20 DIAGNOSIS — Z7982 Long term (current) use of aspirin: Secondary | ICD-10-CM | POA: Diagnosis not present

## 2014-06-20 DIAGNOSIS — Z8669 Personal history of other diseases of the nervous system and sense organs: Secondary | ICD-10-CM | POA: Insufficient documentation

## 2014-06-20 DIAGNOSIS — S39012A Strain of muscle, fascia and tendon of lower back, initial encounter: Secondary | ICD-10-CM | POA: Diagnosis not present

## 2014-06-20 DIAGNOSIS — K219 Gastro-esophageal reflux disease without esophagitis: Secondary | ICD-10-CM | POA: Diagnosis not present

## 2014-06-20 DIAGNOSIS — S3992XA Unspecified injury of lower back, initial encounter: Secondary | ICD-10-CM | POA: Diagnosis present

## 2014-06-20 DIAGNOSIS — Z79899 Other long term (current) drug therapy: Secondary | ICD-10-CM | POA: Insufficient documentation

## 2014-06-20 DIAGNOSIS — Y9389 Activity, other specified: Secondary | ICD-10-CM | POA: Insufficient documentation

## 2014-06-20 DIAGNOSIS — Z87891 Personal history of nicotine dependence: Secondary | ICD-10-CM | POA: Diagnosis not present

## 2014-06-20 MED ORDER — HYDROMORPHONE HCL 1 MG/ML IJ SOLN
1.0000 mg | Freq: Once | INTRAMUSCULAR | Status: AC
  Administered 2014-06-20: 1 mg via INTRAMUSCULAR
  Filled 2014-06-20: qty 1

## 2014-06-20 MED ORDER — KETOROLAC TROMETHAMINE 60 MG/2ML IM SOLN
60.0000 mg | Freq: Once | INTRAMUSCULAR | Status: AC
  Administered 2014-06-20: 60 mg via INTRAMUSCULAR
  Filled 2014-06-20: qty 2

## 2014-06-20 MED ORDER — ONDANSETRON 8 MG PO TBDP
8.0000 mg | ORAL_TABLET | Freq: Once | ORAL | Status: AC
  Administered 2014-06-20: 8 mg via ORAL
  Filled 2014-06-20: qty 1

## 2014-06-20 MED ORDER — ONDANSETRON HCL 4 MG PO TABS: 4.0000 mg | ORAL_TABLET | Freq: Four times a day (QID) | ORAL | Status: AC

## 2014-06-20 MED ORDER — HYDROCODONE-ACETAMINOPHEN 5-325 MG PO TABS
1.0000 | ORAL_TABLET | Freq: Four times a day (QID) | ORAL | Status: AC | PRN
Start: 2014-06-20 — End: ?

## 2014-06-20 MED ORDER — CYCLOBENZAPRINE HCL 10 MG PO TABS: 10.0000 mg | ORAL_TABLET | Freq: Two times a day (BID) | ORAL | Status: AC | PRN

## 2014-06-20 NOTE — ED Notes (Signed)
MD at bedside. 

## 2014-06-20 NOTE — ED Notes (Signed)
Pt c/o fall from standing landing on concrete  c/o lower back pain

## 2014-06-20 NOTE — Discharge Instructions (Signed)

## 2014-06-20 NOTE — ED Notes (Signed)
Pt states nsaids years ago made him nauseated. Pt states PA talked with him about it and feels toradol will not cause nausea.

## 2014-06-20 NOTE — ED Provider Notes (Signed)
CSN: [id number]     Arrival date & time 06/20/14  1538 History   First MD Initiated Contact with Patient 06/20/14 1543     Chief Complaint  Patient presents with  . Fall      Patient is a 49 y.o. male presenting with fall. The history is provided by the patient.  Fall   Jesse Poole presents for evaluation of injuries on the fall. 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 He states that he twisted his back in the fall with one leg spread and front and one leg towards the back. He experienced immediate pain in his back. He had no head injury or loss of consciousness. He denies any numbness or weakness. But he does have pain in the low back that wraps to both sides of his back and down both legs. He has no history of back injury or back pain. Symptoms are moderate, constant, worse with movement.  Past Medical History  Diagnosis Date  . Anxiety   . Hypertension   . GERD (gastroesophageal reflux disease)   . Complication of anesthesia   . PONV (postoperative nausea and vomiting) 1990's    severe migraine on awakening also  . Sleep apnea    Past Surgical History  Procedure Laterality Date  . Epididymectomy  1996  . Lymph node dissection  1970's  . Tonsillectomy  child  . Bronchoscopic removal of foreign body  child    peanut removed   Family History  Problem Relation Age of Onset  . COPD Mother   . Heart failure Father    History  Substance Use Topics  . Smoking status: Former Smoker -- 0.25 packs/day    Types: Cigarettes  . Smokeless tobacco: Never Used  . Alcohol Use: No    Review of Systems  All other systems reviewed and are negative.     Allergies  Latex; Percocet; and Nsaids  Home Medications   Prior to Admission medications   Medication Sig Start Date End Date Taking? Authorizing Provider  ALPRAZolam Prudy Feeler(XANAX) 1 MG tablet Take 1 mg by mouth 3 (three) times daily as needed. For anxiety    Historical Provider, MD   aspirin 81 MG tablet Take 81 mg by mouth daily.    Historical Provider, MD  diazepam (VALIUM) 5 MG tablet 5 mg daily. 12/17/13   Historical Provider, MD  meclizine (ANTIVERT) 25 MG tablet 25 mg daily. 12/17/13   Historical Provider, MD  metoprolol succinate (TOPROL-XL) 50 MG 24 hr tablet 50 mg daily. 11/18/13   Historical Provider, MD  nicotine (NICODERM CQ - DOSED IN MG/24 HOURS) 21 mg/24hr patch 21 patches as directed. 12/25/13   Historical Provider, MD  omeprazole (PRILOSEC) 20 MG capsule Take 20 mg by mouth daily.    Historical Provider, MD   BP 155/78 mmHg  Pulse 72  Temp(Src) 98.5 F (36.9 C) (Oral)  Resp 18  SpO2 100% Physical Exam  Constitutional: He is oriented to person, place, and time. He appears well-developed and well-nourished.  HENT:  Head: Normocephalic and atraumatic.  Cardiovascular: Normal rate and regular rhythm.   No murmur heard. Pulmonary/Chest: Effort normal and breath sounds normal. No respiratory distress.  Abdominal: Soft. There is no tenderness. There is no rebound and no guarding.  Musculoskeletal: He exhibits no edema or tenderness.  Tenderness to palpation over the lower lumbar spine.  Neurological: He is alert and oriented to person, place, and time.  Normal gait, 5 out of 5 strength in bilateral lower extremities with sensation light touch intact in bilateral lower extremities.  Skin: Skin is warm and dry.  Psychiatric: He has a normal mood and affect. His behavior is normal.  Nursing note and vitals reviewed.   ED Course  Procedures (including critical care time) Labs Review Labs Reviewed - No data to display  Imaging Review Dg Lumbar Spine Complete  06/20/2014   CLINICAL DATA:  Fall. Slipped at work. Lower back pain and spasms with pain radiating into the legs. Initial encounter.  EXAM: LUMBAR SPINE - COMPLETE 4+ VIEW  COMPARISON:  CT abdomen and pelvis 07/17/2012  FINDINGS: There are 5 non rib-bearing lumbar type vertebral bodies. No pars defects  are identified. Vertebral alignment is within normal limits. Vertebral body heights are preserved. Mild disc space narrowing is present from L3-4 to L5-S1. Mild lower lumbar facet arthrosis is also noted. Anterior endplate osteophyte formation is most prominent at L3-4.  IMPRESSION: Mild lumbar spondylosis without acute abnormality identified.   Electronically Signed   By: Sebastian Ache   On: 06/20/2014 16:18     EKG Interpretation None      MDM   Final diagnoses:  Fall  Low back strain, initial encounter     Patient here for evaluation of back pain following mechanical fall. Patient is neurologically intact on exam, clinical picture is consistent with lumbosacral strain. No evidence of cauda equina, fracture. Discussed with patient home care with rest, NSAIDs as tolerated, PCP follow-up. Return precautions discussed for evidence of neurologic changes.   Tilden Fossa, MD 06/20/14 [phone]
